# Patient Record
Sex: Female | Born: 1945 | Race: Black or African American | Hispanic: No | Smoking: Former smoker
Health system: Southern US, Community
[De-identification: ages and names within clinical notes are randomized; demographics above are authoritative.]

## PROBLEM LIST (undated history)

## (undated) DIAGNOSIS — I509 Heart failure, unspecified: Secondary | ICD-10-CM

## (undated) DIAGNOSIS — I10 Essential (primary) hypertension: Secondary | ICD-10-CM

## (undated) DIAGNOSIS — J449 Chronic obstructive pulmonary disease, unspecified: Secondary | ICD-10-CM

## (undated) DIAGNOSIS — N189 Chronic kidney disease, unspecified: Secondary | ICD-10-CM

## (undated) DIAGNOSIS — K219 Gastro-esophageal reflux disease without esophagitis: Secondary | ICD-10-CM

## (undated) DIAGNOSIS — I499 Cardiac arrhythmia, unspecified: Secondary | ICD-10-CM

## (undated) DIAGNOSIS — R569 Unspecified convulsions: Secondary | ICD-10-CM

## (undated) HISTORY — PX: ABDOMINAL HYSTERECTOMY: SHX81

## (undated) HISTORY — PX: CARDIAC VALVE REPLACEMENT: SHX585

## (undated) HISTORY — PX: REPLACEMENT TOTAL KNEE: SUR1224

## (undated) HISTORY — PX: OOPHORECTOMY: SHX86

## (undated) HISTORY — PX: TOTAL HIP ARTHROPLASTY: SHX124

## (undated) HISTORY — PX: AORTIC VALVE REPLACEMENT: SHX41

## (undated) HISTORY — PX: TUBAL LIGATION: SHX77

---

## 2008-11-08 DIAGNOSIS — R32 Unspecified urinary incontinence: Secondary | ICD-10-CM | POA: Insufficient documentation

## 2016-09-28 DIAGNOSIS — J42 Unspecified chronic bronchitis: Secondary | ICD-10-CM | POA: Insufficient documentation

## 2016-09-28 DIAGNOSIS — I10 Essential (primary) hypertension: Secondary | ICD-10-CM | POA: Insufficient documentation

## 2016-09-28 DIAGNOSIS — Z952 Presence of prosthetic heart valve: Secondary | ICD-10-CM | POA: Insufficient documentation

## 2016-09-28 DIAGNOSIS — R911 Solitary pulmonary nodule: Secondary | ICD-10-CM | POA: Insufficient documentation

## 2016-09-28 DIAGNOSIS — E7800 Pure hypercholesterolemia, unspecified: Secondary | ICD-10-CM | POA: Insufficient documentation

## 2016-11-23 ENCOUNTER — Other Ambulatory Visit: Payer: Self-pay | Admitting: Obstetrics & Gynecology

## 2016-11-23 DIAGNOSIS — Z1231 Encounter for screening mammogram for malignant neoplasm of breast: Secondary | ICD-10-CM

## 2016-12-27 ENCOUNTER — Ambulatory Visit: Payer: Self-pay

## 2017-01-06 DIAGNOSIS — Z0000 Encounter for general adult medical examination without abnormal findings: Secondary | ICD-10-CM | POA: Insufficient documentation

## 2017-01-19 ENCOUNTER — Ambulatory Visit
Admission: RE | Admit: 2017-01-19 | Discharge: 2017-01-19 | Disposition: A | Discharge status: Home / Self Care | Payer: Federal, State, Local not specified - PPO | Source: Ambulatory Visit | Attending: Obstetrics & Gynecology | Admitting: Obstetrics & Gynecology

## 2017-01-19 ENCOUNTER — Encounter: Payer: Self-pay | Admitting: Radiology

## 2017-01-19 DIAGNOSIS — Z1231 Encounter for screening mammogram for malignant neoplasm of breast: Secondary | ICD-10-CM | POA: Diagnosis not present

## 2017-02-15 ENCOUNTER — Other Ambulatory Visit: Payer: Self-pay | Admitting: Specialist

## 2017-02-15 DIAGNOSIS — R911 Solitary pulmonary nodule: Secondary | ICD-10-CM

## 2017-02-25 ENCOUNTER — Ambulatory Visit
Admission: RE | Admit: 2017-02-25 | Discharge: 2017-02-25 | Disposition: A | Discharge status: Home / Self Care | Payer: Federal, State, Local not specified - PPO | Source: Ambulatory Visit | Attending: Specialist | Admitting: Specialist

## 2017-02-25 DIAGNOSIS — R911 Solitary pulmonary nodule: Secondary | ICD-10-CM | POA: Diagnosis not present

## 2017-02-25 DIAGNOSIS — I2510 Atherosclerotic heart disease of native coronary artery without angina pectoris: Secondary | ICD-10-CM | POA: Diagnosis not present

## 2017-02-25 DIAGNOSIS — I700 Atherosclerosis of aorta: Secondary | ICD-10-CM | POA: Insufficient documentation

## 2017-02-25 DIAGNOSIS — J449 Chronic obstructive pulmonary disease, unspecified: Secondary | ICD-10-CM | POA: Insufficient documentation

## 2017-02-25 DIAGNOSIS — N289 Disorder of kidney and ureter, unspecified: Secondary | ICD-10-CM | POA: Diagnosis not present

## 2017-03-17 ENCOUNTER — Encounter: Payer: Self-pay | Admitting: Emergency Medicine

## 2017-03-17 ENCOUNTER — Emergency Department
Admission: EM | Admit: 2017-03-17 | Discharge: 2017-03-17 | Disposition: A | Discharge status: Home / Self Care | Payer: Federal, State, Local not specified - PPO | Attending: Emergency Medicine | Admitting: Emergency Medicine

## 2017-03-17 DIAGNOSIS — R040 Epistaxis: Secondary | ICD-10-CM

## 2017-03-17 LAB — CBC WITH DIFFERENTIAL/PLATELET
BASOS PCT: 1 %
Basophils Absolute: 0 10*3/uL (ref 0–0.1)
EOS ABS: 0.2 10*3/uL (ref 0–0.7)
Eosinophils Relative: 4 %
HEMATOCRIT: 38.6 % (ref 35.0–47.0)
HEMOGLOBIN: 12.7 g/dL (ref 12.0–16.0)
LYMPHS ABS: 1.6 10*3/uL (ref 1.0–3.6)
Lymphocytes Relative: 31 %
MCH: 26 pg (ref 26.0–34.0)
MCHC: 32.9 g/dL (ref 32.0–36.0)
MCV: 79 fL — ABNORMAL LOW (ref 80.0–100.0)
MONO ABS: 0.5 10*3/uL (ref 0.2–0.9)
MONOS PCT: 11 %
NEUTROS ABS: 2.8 10*3/uL (ref 1.4–6.5)
NEUTROS PCT: 53 %
Platelets: 156 10*3/uL (ref 150–440)
RBC: 4.88 MIL/uL (ref 3.80–5.20)
RDW: 14.3 % (ref 11.5–14.5)
WBC: 5.1 10*3/uL (ref 3.6–11.0)

## 2017-03-17 LAB — BASIC METABOLIC PANEL
Anion gap: 8 (ref 5–15)
BUN: 15 mg/dL (ref 6–20)
CHLORIDE: 108 mmol/L (ref 101–111)
CO2: 27 mmol/L (ref 22–32)
CREATININE: 1.09 mg/dL — AB (ref 0.44–1.00)
Calcium: 9.5 mg/dL (ref 8.9–10.3)
GFR calc Af Amer: 57 mL/min — ABNORMAL LOW (ref >60)
GFR calc non Af Amer: 49 mL/min — ABNORMAL LOW (ref >60)
GLUCOSE: 116 mg/dL — AB (ref 65–99)
POTASSIUM: 3.8 mmol/L (ref 3.5–5.1)
Sodium: 143 mmol/L (ref 135–145)

## 2017-03-17 LAB — PROTIME-INR
INR: 3.34
PROTHROMBIN TIME: 34.6 s — AB (ref 11.4–15.2)

## 2017-03-17 MED ORDER — AMOXICILLIN-POT CLAVULANATE 875-125 MG PO TABS: 1.0000 | ORAL_TABLET | Freq: Two times a day (BID) | ORAL | 0 refills | Status: AC

## 2017-03-17 MED ORDER — TRANEXAMIC ACID 1000 MG/10ML IV SOLN
500.0000 mg | Freq: Once | INTRAVENOUS | Status: AC
  Administered 2017-03-17: 500 mg via TOPICAL
  Filled 2017-03-17: qty 10

## 2017-03-17 MED ORDER — CEFUROXIME AXETIL 500 MG PO TABS
500.0000 mg | ORAL_TABLET | Freq: Once | ORAL | Status: DC
  Administered 2017-03-17: 500 mg via ORAL
  Filled 2017-03-17: qty 1

## 2017-03-17 MED ORDER — ACETAMINOPHEN 500 MG PO TABS
1000.0000 mg | ORAL_TABLET | Freq: Once | ORAL | Status: AC
  Administered 2017-03-17: 1000 mg via ORAL
  Filled 2017-03-17: qty 2

## 2017-03-17 MED ORDER — AMOXICILLIN-POT CLAVULANATE 875-125 MG PO TABS: 1.0000 | ORAL_TABLET | Freq: Once | ORAL | Status: DC

## 2017-03-17 MED ORDER — CEFUROXIME AXETIL 500 MG PO TABS: 500.0000 mg | ORAL_TABLET | Freq: Two times a day (BID) | ORAL | 0 refills | Status: AC

## 2017-03-17 MED ORDER — SILVER NITRATE-POT NITRATE 75-25 % EX MISC
CUTANEOUS | Status: AC
  Filled 2017-03-17: qty 1

## 2017-03-17 MED ORDER — TRANEXAMIC ACID 1000 MG/10ML IV SOLN: 500.0000 mg | Freq: Once | INTRAVENOUS | Status: DC

## 2017-03-17 NOTE — ED Provider Notes (Addendum)
[facility] Emergency Department Provider Note  ____________________________________________   First MD Initiated Contact with Patient 03/17/17 [phone]     (approximate)  I have reviewed the triage vital signs and the nursing notes.   HISTORY  Chief Complaint Epistaxis   HPI Janice Tyler is a 71 y.o. female with a history of an aortic valve replacement on Coumadin who is presenting with a left nostril nosebleed. She says that she has had a cough and cold with runny nose recently and thinks this is the trigger for her nose bleeding. She said that she also had her Coumadin dose raised several weeks ago but since then has had a blood draw with a normal/therapeutic INR.   History reviewed. No pertinent past medical history.  There are no active problems to display for this patient.   Past Surgical History:  Procedure Laterality Date  . ABDOMINAL HYSTERECTOMY    . AORTIC VALVE REPLACEMENT    . REPLACEMENT TOTAL KNEE Bilateral   . TOTAL HIP ARTHROPLASTY Bilateral     Prior to Admission medications   Not on File    Allergies Patient has no known allergies.  No family history on file.  Social History Social History  Substance Use Topics  . Smoking status: Never Smoker  . Smokeless tobacco: Never Used  . Alcohol use Not on file    Review of Systems  Constitutional: No fever/chills Eyes: No visual changes. ENT: No sore throat. Cardiovascular: Denies chest pain. Respiratory: Denies shortness of breath. Gastrointestinal: No abdominal pain.  No nausea, no vomiting.  No diarrhea.  No constipation. Genitourinary: Negative for dysuria. Musculoskeletal: Negative for back pain. Skin: Negative for rash. Neurological: Negative for headaches, focal weakness or numbness.   ____________________________________________   PHYSICAL EXAM:  VITAL SIGNS: ED Triage Vitals [03/17/17 0128]  Enc Vitals Group     BP (!) 158/62     Pulse Rate (!) 54   Resp 18     Temp 97.7 F (36.5 C)     Temp Source Oral     SpO2 98 %     Weight 150 lb (68 kg)     Height 5\' 2"  (1.575 m)     Head Circumference      Peak Flow      Pain Score      Pain Loc      Pain Edu?      Excl. in [place]?     Constitutional: Alert and oriented. Well appearing and in no acute distress. Eyes: Conjunctivae are normal. PERRL. EOMI. Head: Atraumatic. Nose:   Wearing the nasal clamps without any bleeding around the clamp. Nasal clamp was removed and blood seen in the left naris without any active bleeding in the posterior pharynx. Clamp was replaced.   Mouth/Throat: Mucous membranes are moist.  Oropharynx non-erythematous. Neck: No stridor.   Cardiovascular: Normal rate, regular rhythm. Grossly normal heart sounds.   Respiratory: Normal respiratory effort.  No retractions. Lungs CTAB. Gastrointestinal: Soft and nontender. No distention.  Musculoskeletal: No lower extremity tenderness nor edema.  No joint effusions. Neurologic:  Normal speech and language. No gross focal neurologic deficits are appreciated.  Skin:  Skin is warm, dry and intact. No rash noted. Psychiatric: Mood and affect are normal. Speech and behavior are normal.  ____________________________________________   LABS (all labs ordered are listed, but only abnormal results are displayed)  Labs Reviewed  CBC WITH DIFFERENTIAL/PLATELET - Abnormal; Notable for the following:       Result  Value   MCV 79.0 (*)    All other components within normal limits  BASIC METABOLIC PANEL - Abnormal; Notable for the following:    Glucose, Bld 116 (*)    Creatinine, Ser 1.09 (*)    GFR calc non Af Amer 49 (*)    GFR calc Af Amer 57 (*)    All other components within normal limits  PROTIME-INR - Abnormal; Notable for the following:    Prothrombin Time 34.6 (*)    All other components within normal limits    ____________________________________________  EKG   ____________________________________________  RADIOLOGY   ____________________________________________   PROCEDURES  Procedure(s) performed:   Procedures  Critical Care performed:   ____________________________________________   INITIAL IMPRESSION / ASSESSMENT AND PLAN / ED COURSE  Pertinent labs & imaging results that were available during my care of the patient were reviewed by me and considered in my medical decision making (see chart for details).  ----------------------------------------- 2:29 AM on 03/17/2017 -----------------------------------------  Clamp was removed and there is still oozing from the left nostril. I had the patient blow her nose and she blew out one very large clots. Clamp was then replaced.    ----------------------------------------- 5:40 AM on 03/17/2017 -----------------------------------------  After simple nasal clamps failed we inserted cotton soaked with TX A and to the left naris. Patient continued to bleed through the cotton ball soaked with [place] a. I was able to visualize several spots and cauterize with silver nitrate. However, the patient said she still felt the sensation of blood trickling down the back of her pharynx was unable to see any obvious trickling of blood down the back of the pharynx. She was also having consistent oozing still anteriorly. At this point the decision was made to place packing. A 5.5 cm Rhino Rocket was placed left naris. It was soaked in sterile water prior to insertion and inflation with air. Adequate tamponade was achieved with the balloon still compressible and not tight to the point to cause blood flow to be fully impeded. Patient says that she feels improved at this time. Does not have the sensation of blood trickling down the back of her throat anymore. Also without any bleeding anteriorly.  Clots were cleared with the patient blow her nose prior to  each attempt for hemostasis. She'll be placed on Ceftin and will follow up with ENT. Patient's INR 3.3. She says that her goal INR between 2.5 and 3.5.  ____________________________________________   FINAL CLINICAL IMPRESSION(S) / ED DIAGNOSES  Epistaxis.    NEW MEDICATIONS STARTED DURING THIS VISIT:  New Prescriptions   No medications on file     Note:  This document was prepared using Dragon voice recognition software and may include unintentional dictation errors.    Orbie Pyo, MD 03/17/17 [phone]  Patient is aware that she must monitor her INR closely and says that she is scheduled to have her INR checked by the end of the week. We discussed the possible rash between the Augmentin and Coumadin and she is aware, understanding and willing to comply with this plan.    Orbie Pyo, MD 03/17/17 [phone]

## 2017-03-17 NOTE — ED Triage Notes (Signed)
Patient ambulatory to triage with steady gait, without difficulty or distress noted; pt reports nosebleed from left nare for last 1-2hrs; denies hx of same; denies any c/o at present

## 2017-04-12 DIAGNOSIS — I2510 Atherosclerotic heart disease of native coronary artery without angina pectoris: Secondary | ICD-10-CM | POA: Insufficient documentation

## 2017-04-12 DIAGNOSIS — I700 Atherosclerosis of aorta: Secondary | ICD-10-CM | POA: Insufficient documentation

## 2017-06-06 DIAGNOSIS — G609 Hereditary and idiopathic neuropathy, unspecified: Secondary | ICD-10-CM | POA: Insufficient documentation

## 2017-06-06 DIAGNOSIS — G40909 Epilepsy, unspecified, not intractable, without status epilepticus: Secondary | ICD-10-CM | POA: Insufficient documentation

## 2017-09-07 ENCOUNTER — Other Ambulatory Visit: Payer: Self-pay | Admitting: Specialist

## 2017-09-07 DIAGNOSIS — R0609 Other forms of dyspnea: Secondary | ICD-10-CM

## 2017-09-07 DIAGNOSIS — R0600 Dyspnea, unspecified: Secondary | ICD-10-CM

## 2017-12-13 ENCOUNTER — Other Ambulatory Visit: Payer: Self-pay | Admitting: Internal Medicine

## 2017-12-13 DIAGNOSIS — Z1231 Encounter for screening mammogram for malignant neoplasm of breast: Secondary | ICD-10-CM

## 2017-12-28 ENCOUNTER — Other Ambulatory Visit: Payer: Self-pay | Admitting: Student

## 2017-12-28 DIAGNOSIS — M5441 Lumbago with sciatica, right side: Principal | ICD-10-CM

## 2017-12-28 DIAGNOSIS — G8929 Other chronic pain: Secondary | ICD-10-CM

## 2018-01-06 ENCOUNTER — Ambulatory Visit
Admission: RE | Admit: 2018-01-06 | Discharge: 2018-01-06 | Disposition: A | Discharge status: Home / Self Care | Payer: Federal, State, Local not specified - PPO | Source: Ambulatory Visit | Attending: Student | Admitting: Student

## 2018-01-06 DIAGNOSIS — M4316 Spondylolisthesis, lumbar region: Secondary | ICD-10-CM | POA: Diagnosis not present

## 2018-01-06 DIAGNOSIS — M48061 Spinal stenosis, lumbar region without neurogenic claudication: Secondary | ICD-10-CM | POA: Insufficient documentation

## 2018-01-06 DIAGNOSIS — M5127 Other intervertebral disc displacement, lumbosacral region: Secondary | ICD-10-CM | POA: Insufficient documentation

## 2018-01-06 DIAGNOSIS — M5126 Other intervertebral disc displacement, lumbar region: Secondary | ICD-10-CM | POA: Insufficient documentation

## 2018-01-06 DIAGNOSIS — M5441 Lumbago with sciatica, right side: Secondary | ICD-10-CM | POA: Diagnosis present

## 2018-01-06 DIAGNOSIS — M4807 Spinal stenosis, lumbosacral region: Secondary | ICD-10-CM | POA: Diagnosis not present

## 2018-01-06 DIAGNOSIS — G8929 Other chronic pain: Secondary | ICD-10-CM | POA: Diagnosis present

## 2018-01-10 ENCOUNTER — Other Ambulatory Visit: Payer: Self-pay | Admitting: Student

## 2018-01-10 DIAGNOSIS — M47816 Spondylosis without myelopathy or radiculopathy, lumbar region: Secondary | ICD-10-CM

## 2018-01-23 ENCOUNTER — Ambulatory Visit
Admission: RE | Admit: 2018-01-23 | Discharge: 2018-01-23 | Disposition: A | Discharge status: Home / Self Care | Payer: Federal, State, Local not specified - PPO | Source: Ambulatory Visit | Attending: Student | Admitting: Student

## 2018-01-23 ENCOUNTER — Other Ambulatory Visit: Payer: Self-pay | Admitting: Student

## 2018-01-23 DIAGNOSIS — M47816 Spondylosis without myelopathy or radiculopathy, lumbar region: Secondary | ICD-10-CM

## 2018-01-23 MED ORDER — METHYLPREDNISOLONE ACETATE 40 MG/ML INJ SUSP (RADIOLOG
120.0000 mg | Freq: Once | INTRAMUSCULAR | Status: AC
  Administered 2018-01-23: 120 mg via EPIDURAL

## 2018-01-23 MED ORDER — IOPAMIDOL (ISOVUE-M 200) INJECTION 41%
1.0000 mL | Freq: Once | INTRAMUSCULAR | Status: AC
  Administered 2018-01-23: 1 mL via EPIDURAL

## 2018-01-23 NOTE — Discharge Instructions (Signed)

## 2018-01-31 ENCOUNTER — Ambulatory Visit
Admission: RE | Admit: 2018-01-31 | Discharge: 2018-01-31 | Disposition: A | Discharge status: Home / Self Care | Payer: Federal, State, Local not specified - PPO | Source: Ambulatory Visit | Attending: Internal Medicine | Admitting: Internal Medicine

## 2018-01-31 DIAGNOSIS — Z1231 Encounter for screening mammogram for malignant neoplasm of breast: Secondary | ICD-10-CM

## 2018-02-07 ENCOUNTER — Inpatient Hospital Stay
Admission: RE | Admit: 2018-02-07 | Discharge: 2018-02-07 | Disposition: A | Discharge status: Home / Self Care | Payer: Self-pay | Source: Ambulatory Visit | Attending: *Deleted | Admitting: *Deleted

## 2018-02-07 ENCOUNTER — Other Ambulatory Visit: Payer: Self-pay | Admitting: *Deleted

## 2018-02-07 DIAGNOSIS — Z9289 Personal history of other medical treatment: Secondary | ICD-10-CM

## 2018-02-21 DIAGNOSIS — E785 Hyperlipidemia, unspecified: Secondary | ICD-10-CM | POA: Insufficient documentation

## 2018-02-21 DIAGNOSIS — E1169 Type 2 diabetes mellitus with other specified complication: Secondary | ICD-10-CM | POA: Insufficient documentation

## 2018-02-21 DIAGNOSIS — E118 Type 2 diabetes mellitus with unspecified complications: Secondary | ICD-10-CM | POA: Insufficient documentation

## 2018-03-02 ENCOUNTER — Telehealth: Payer: Self-pay | Admitting: *Deleted

## 2018-03-02 DIAGNOSIS — Z122 Encounter for screening for malignant neoplasm of respiratory organs: Secondary | ICD-10-CM

## 2018-03-02 DIAGNOSIS — Z87891 Personal history of nicotine dependence: Secondary | ICD-10-CM

## 2018-03-02 NOTE — Telephone Encounter (Signed)
Received referral for initial lung cancer screening scan. Contacted patient and obtained smoking history,(former, quit 2015, 42 pack year) as well as answering questions related to screening process. Patient denies signs of lung cancer such as weight loss or hemoptysis. Patient denies comorbidity that would prevent curative treatment if lung cancer were found. Patient is scheduled for shared decision making visit and CT scan on 03/21/18.

## 2018-03-21 ENCOUNTER — Inpatient Hospital Stay: Payer: Federal, State, Local not specified - PPO | Attending: Oncology | Admitting: Oncology

## 2018-03-21 ENCOUNTER — Ambulatory Visit
Admission: RE | Admit: 2018-03-21 | Discharge: 2018-03-21 | Disposition: A | Discharge status: Home / Self Care | Payer: Federal, State, Local not specified - PPO | Source: Ambulatory Visit | Attending: Oncology | Admitting: Oncology

## 2018-03-21 ENCOUNTER — Encounter: Payer: Self-pay | Admitting: Oncology

## 2018-03-21 DIAGNOSIS — Z87891 Personal history of nicotine dependence: Secondary | ICD-10-CM

## 2018-03-21 DIAGNOSIS — N289 Disorder of kidney and ureter, unspecified: Secondary | ICD-10-CM | POA: Insufficient documentation

## 2018-03-21 DIAGNOSIS — I2510 Atherosclerotic heart disease of native coronary artery without angina pectoris: Secondary | ICD-10-CM | POA: Diagnosis not present

## 2018-03-21 DIAGNOSIS — I700 Atherosclerosis of aorta: Secondary | ICD-10-CM | POA: Diagnosis not present

## 2018-03-21 DIAGNOSIS — N200 Calculus of kidney: Secondary | ICD-10-CM | POA: Insufficient documentation

## 2018-03-21 DIAGNOSIS — Z122 Encounter for screening for malignant neoplasm of respiratory organs: Secondary | ICD-10-CM | POA: Insufficient documentation

## 2018-03-21 DIAGNOSIS — J432 Centrilobular emphysema: Secondary | ICD-10-CM | POA: Diagnosis not present

## 2018-03-21 NOTE — Progress Notes (Signed)
In accordance with CMS guidelines, patient has met eligibility criteria including age, absence of signs or symptoms of lung cancer.  Social History   Tobacco Use  . Smoking status: Former Smoker    Packs/day: 1.00    Years: 42.00    Pack years: 42.00    Last attempt to quit: 2015    Years since quitting: 4.3  . Smokeless tobacco: Never Used  Substance Use Topics  . Alcohol use: Not on file  . Drug use: Not on file     A shared decision-making session was conducted prior to the performance of CT scan. This includes one or more decision aids, includes benefits and harms of screening, follow-up diagnostic testing, over-diagnosis, false positive rate, and total radiation exposure.  Counseling on the importance of adherence to annual lung cancer LDCT screening, impact of co-morbidities, and ability or willingness to undergo diagnosis and treatment is imperative for compliance of the program.  Counseling on the importance of continued smoking cessation for former smokers; the importance of smoking cessation for current smokers, and information about tobacco cessation interventions have been given to patient including [place] and 1800 quit [place] programs.  Written order for lung cancer screening with LDCT has been given to the patient and any and all questions have been answered to the best of my abilities.   Yearly follow up will be coordinated by Burgess Estelle, Thoracic Navigator.  Faythe Casa, NP 03/21/2018 2:42 PM

## 2018-03-24 ENCOUNTER — Encounter: Payer: Self-pay | Admitting: *Deleted

## 2018-08-10 ENCOUNTER — Ambulatory Visit
Admission: RE | Admit: 2018-08-10 | Discharge: 2018-08-10 | Disposition: A | Discharge status: Home / Self Care | Payer: Federal, State, Local not specified - PPO | Source: Ambulatory Visit | Attending: Specialist | Admitting: Specialist

## 2018-08-10 DIAGNOSIS — J449 Chronic obstructive pulmonary disease, unspecified: Secondary | ICD-10-CM | POA: Diagnosis not present

## 2018-08-10 DIAGNOSIS — R0609 Other forms of dyspnea: Secondary | ICD-10-CM

## 2018-08-10 DIAGNOSIS — R0600 Dyspnea, unspecified: Secondary | ICD-10-CM

## 2018-08-23 DIAGNOSIS — Z9989 Dependence on other enabling machines and devices: Secondary | ICD-10-CM | POA: Insufficient documentation

## 2018-09-30 ENCOUNTER — Other Ambulatory Visit: Payer: Self-pay

## 2018-09-30 ENCOUNTER — Encounter: Payer: Self-pay | Admitting: Emergency Medicine

## 2018-09-30 ENCOUNTER — Emergency Department
Admission: EM | Admit: 2018-09-30 | Discharge: 2018-09-30 | Disposition: A | Discharge status: Home / Self Care | Payer: Federal, State, Local not specified - PPO | Attending: Student in an Organized Health Care Education/Training Program | Admitting: Student in an Organized Health Care Education/Training Program

## 2018-09-30 DIAGNOSIS — Z96652 Presence of left artificial knee joint: Secondary | ICD-10-CM | POA: Diagnosis not present

## 2018-09-30 DIAGNOSIS — Z96651 Presence of right artificial knee joint: Secondary | ICD-10-CM | POA: Diagnosis not present

## 2018-09-30 DIAGNOSIS — M5431 Sciatica, right side: Secondary | ICD-10-CM | POA: Diagnosis not present

## 2018-09-30 DIAGNOSIS — Z96641 Presence of right artificial hip joint: Secondary | ICD-10-CM | POA: Diagnosis not present

## 2018-09-30 DIAGNOSIS — Z96642 Presence of left artificial hip joint: Secondary | ICD-10-CM | POA: Diagnosis not present

## 2018-09-30 DIAGNOSIS — Z87891 Personal history of nicotine dependence: Secondary | ICD-10-CM | POA: Diagnosis not present

## 2018-09-30 DIAGNOSIS — M5441 Lumbago with sciatica, right side: Secondary | ICD-10-CM | POA: Insufficient documentation

## 2018-09-30 DIAGNOSIS — M549 Dorsalgia, unspecified: Secondary | ICD-10-CM | POA: Diagnosis present

## 2018-09-30 DIAGNOSIS — Z952 Presence of prosthetic heart valve: Secondary | ICD-10-CM | POA: Diagnosis not present

## 2018-09-30 DIAGNOSIS — G8929 Other chronic pain: Secondary | ICD-10-CM | POA: Insufficient documentation

## 2018-09-30 DIAGNOSIS — I10 Essential (primary) hypertension: Secondary | ICD-10-CM | POA: Insufficient documentation

## 2018-09-30 HISTORY — DX: Essential (primary) hypertension: I10

## 2018-09-30 LAB — URINALYSIS, COMPLETE (UACMP) WITH MICROSCOPIC
BILIRUBIN URINE: NEGATIVE
Bacteria, UA: NONE SEEN
Glucose, UA: NEGATIVE mg/dL
HGB URINE DIPSTICK: NEGATIVE
Ketones, ur: NEGATIVE mg/dL
LEUKOCYTES UA: NEGATIVE
NITRITE: NEGATIVE
PROTEIN: 100 mg/dL — AB
Specific Gravity, Urine: 1.02 (ref 1.005–1.030)
pH: 5 (ref 5.0–8.0)

## 2018-09-30 MED ORDER — PREDNISONE 10 MG (48) PO TBPK: ORAL_TABLET | ORAL | 0 refills | Status: DC

## 2018-09-30 MED ORDER — HYDROCODONE-ACETAMINOPHEN 5-325 MG PO TABS: 1.0000 | ORAL_TABLET | Freq: Four times a day (QID) | ORAL | 0 refills | Status: DC | PRN

## 2018-09-30 MED ORDER — HYDROCODONE-ACETAMINOPHEN 5-325 MG PO TABS
1.0000 | ORAL_TABLET | Freq: Once | ORAL | Status: AC
  Administered 2018-09-30: 1 via ORAL
  Filled 2018-09-30: qty 1

## 2018-09-30 NOTE — ED Provider Notes (Signed)
[facility] Emergency Department Provider Note  ____________________________________________   First MD Initiated Contact with Patient 09/30/18 1140     (approximate)  I have reviewed the triage vital signs and the nursing notes.   HISTORY  Chief Complaint Back Pain   HPI Janice Tyler is a 72 y.o. female presents to the ED with complaint of chronic back pain with radiation down her right leg.  Patient states that she has been tolerating this for approximately 1 week.  She denies any recent injury.  She has a history of low back pain and is had epidural injections in the past.  She also has been on prednisone without any problems with her diabetes type 2.  Patient denies any urinary symptoms or incontinence of bowel or bladder.  She has seen [facility] orthopedic department in the past for her back problems.  She rates her pain as a 10/10.  Past Medical History:  Diagnosis Date  . Hypertension     There are no active problems to display for this patient.   Past Surgical History:  Procedure Laterality Date  . ABDOMINAL HYSTERECTOMY    . AORTIC VALVE REPLACEMENT    . OOPHORECTOMY    . REPLACEMENT TOTAL KNEE Bilateral   . TOTAL HIP ARTHROPLASTY Bilateral     Prior to Admission medications   Medication Sig Start Date End Date Taking? Authorizing Provider  HYDROcodone-acetaminophen (NORCO/VICODIN) 5-325 MG tablet Take 1 tablet by mouth every 6 (six) hours as needed for moderate pain. 09/30/18   Johnn Hai, PA-C  predniSONE (STERAPRED UNI-PAK 48 TAB) 10 MG (48) TBPK tablet As directed 09/30/18   Johnn Hai, PA-C    Allergies Patient has no known allergies.  History reviewed. No pertinent family history.  Social History Social History   Tobacco Use  . Smoking status: Former Smoker    Packs/day: 1.00    Years: 42.00    Pack years: 42.00    Last attempt to quit: 2015    Years since quitting: 4.8  . Smokeless tobacco: Never  Used  Substance Use Topics  . Alcohol use: Never    Frequency: Never  . Drug use: Never    Review of Systems Constitutional: No fever/chills Cardiovascular: Denies chest pain. Respiratory: Denies shortness of breath. Gastrointestinal: No abdominal pain.  No nausea, no vomiting.  No diarrhea.  No constipation. Genitourinary: Negative for dysuria. Musculoskeletal: Positive for chronic low back pain with acute exacerbation.  Positive for right leg radiculopathy. Skin: Negative for rash. Neurological: Negative for headaches, focal weakness or numbness. ___________________________________________   PHYSICAL EXAM:  VITAL SIGNS: ED Triage Vitals  Enc Vitals Group     BP 09/30/18 1122 (!) 156/59     Pulse Rate 09/30/18 1122 (!) 53     Resp 09/30/18 1122 18     Temp 09/30/18 1122 98 F (36.7 C)     Temp Source 09/30/18 1122 Oral     SpO2 09/30/18 1122 99 %     Weight 09/30/18 1123 147 lb 14.9 oz (67.1 kg)     Height --      Head Circumference --      Peak Flow --      Pain Score 09/30/18 1122 10     Pain Loc --      Pain Edu? --      Excl. in [place]? --    Constitutional: Alert and oriented. Well appearing and in no acute distress. Eyes: Conjunctivae are normal.  Head: Atraumatic. Neck: No stridor.   Cardiovascular: Normal rate, regular rhythm. Grossly normal heart sounds.  Good peripheral circulation. Respiratory: Normal respiratory effort.  No retractions. Lungs CTAB. Gastrointestinal: Soft and nontender. No distention.  No CVA tenderness. Musculoskeletal: Examination of lumbar spine there is no point tenderness on palpation of the vertebral bodies however there is moderate tenderness on pain patient of the right SI joint area and soft tissue.  Good muscle strength bilaterally.  Straight leg raises were negative on the left lower extremity.  Right lower extremity is approximately 30 degrees with patient sitting. Neurologic:  Normal speech and language. No gross focal neurologic  deficits are appreciated.  Reflexes are 2+ bilaterally.  Skin:  Skin is warm, dry and intact. No rash noted. Psychiatric: Mood and affect are normal. Speech and behavior are normal.  ____________________________________________   LABS (all labs ordered are listed, but only abnormal results are displayed)  Labs Reviewed  URINALYSIS, COMPLETE (UACMP) WITH MICROSCOPIC - Abnormal; Notable for the following components:      Result Value   Color, Urine YELLOW (*)    APPearance CLEAR (*)    Protein, ur 100 (*)    All other components within normal limits    PROCEDURES  Procedure(s) performed: None  Procedures  Critical Care performed: No  ____________________________________________   INITIAL IMPRESSION / ASSESSMENT AND PLAN / ED COURSE  As part of my medical decision making, I reviewed the following data within the electronic MEDICAL RECORD NUMBER Notes from prior ED visits and [place] Controlled Substance Database  Patient presents to the ED with complaint of low back pain with radiation into her right leg.  She does have a history of chronic back pain with right leg radiculopathy.  Patient has been seen at [facility] orthopedic department for her back pain.  Records indicate that the last time she was on steroids was in January which preceded an epidural injection.  Patient has been seen at the orthopedic department since that time.  She denies any urinary symptoms and urinalysis was negative.  Patient was given Norco while in the ED with some relief of her back pain.  We will do a another course of prednisone and patient is aware that this may interfere or increase her blood sugar.  Patient states that she had no problems taking this in January.  She is to call the orthopedic department at [facility] and make an appointment for reevaluation.  Also a prescription for Norco was given as needed for moderate to severe pain.  She is is aware that she needs to return to the emergency  department if any severe worsening of her symptoms.   ____________________________________________   FINAL CLINICAL IMPRESSION(S) / ED DIAGNOSES  Final diagnoses:  Sciatica of right side  Chronic right-sided low back pain with right-sided sciatica     ED Discharge Orders         Ordered    predniSONE (STERAPRED UNI-PAK 48 TAB) 10 MG (48) TBPK tablet     09/30/18 1246    HYDROcodone-acetaminophen (NORCO/VICODIN) 5-325 MG tablet  Every 6 hours PRN     09/30/18 1246           Note:  This document was prepared using Dragon voice recognition software and may include unintentional dictation errors.    Johnn Hai, PA-C 09/30/18 1300    Merlyn Lot, MD 09/30/18 [phone]

## 2018-09-30 NOTE — ED Notes (Signed)
Pt states pain in right leg started in calf and has moved up to her lower back.

## 2018-09-30 NOTE — ED Triage Notes (Signed)
Pt to ed with c/o right lower back pain and R leg pain x 1 week.  Denies injury. Pt states pain worse with any movement.

## 2018-09-30 NOTE — Discharge Instructions (Addendum)
Call make an appointment with the orthopedist if any continued problems.  Also talked to [facility] about future epidurals and wear you may require one while being sedated.  Begin taking prednisone as directed.  Norco is for pain if needed every 6 hours.  Do not drive or operate machinery while taking this medication.  This medication could cause drowsiness and increase her risk for falling.  Return to the emergency department if any severe worsening of your symptoms.

## 2018-09-30 NOTE — ED Notes (Signed)
Pt verbalized understanding of discharge instructions. NAD at this time. 

## 2018-10-07 IMAGING — MG MM DIGITAL SCREENING BILAT W/ TOMO W/ CAD
8 of 12 series · 8 of 28 positions shown · non-contrast
Comparison: Previous exam(s).

CLINICAL DATA: Screening.

EXAM:
DIGITAL SCREENING BILATERAL MAMMOGRAM WITH TOMO AND CAD

[R MLO synth-2D]
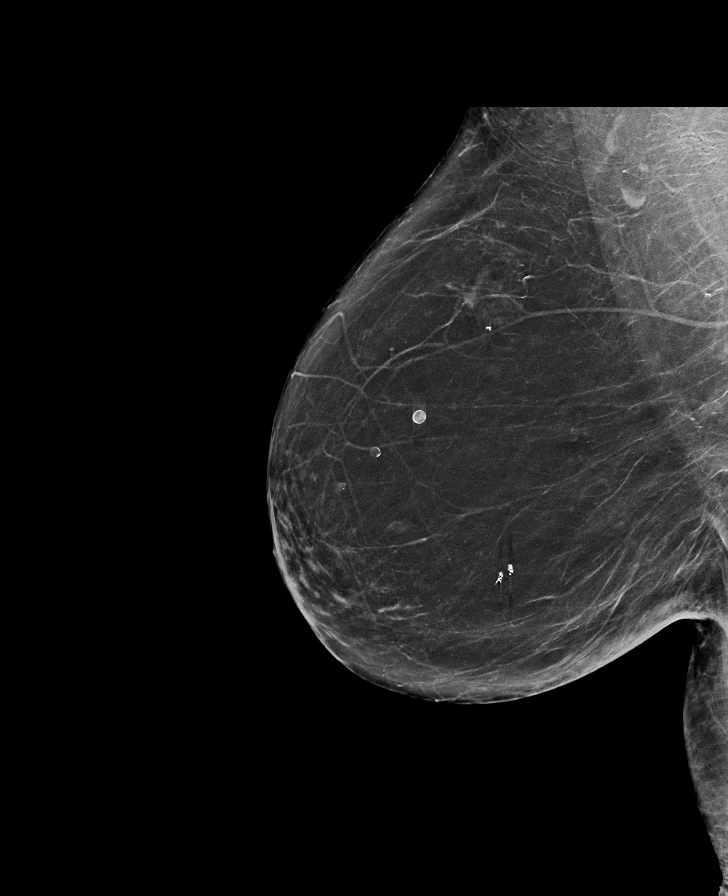

[L MLO]
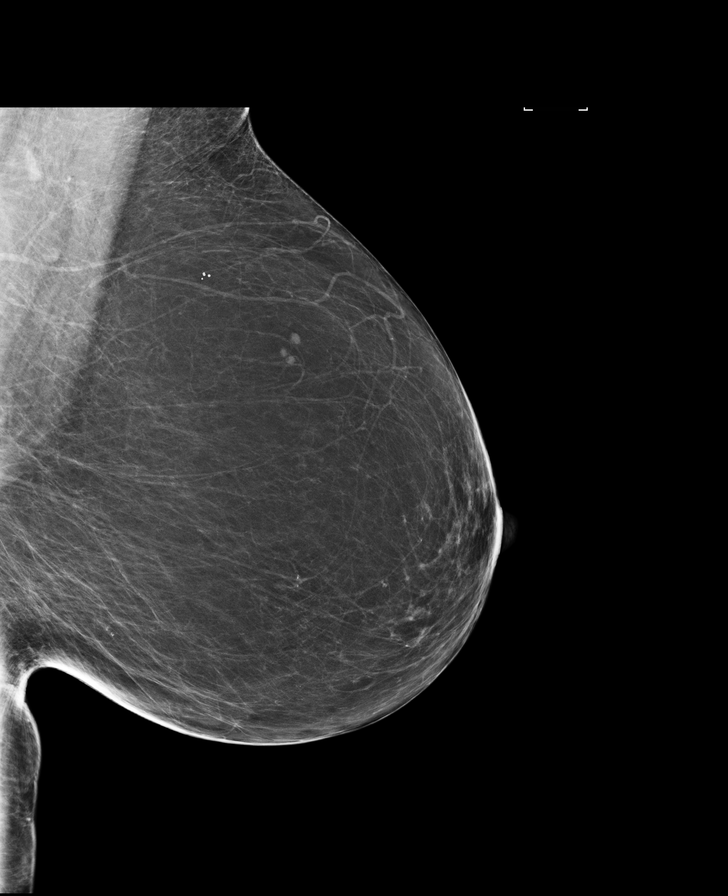

[R CC synth-2D]
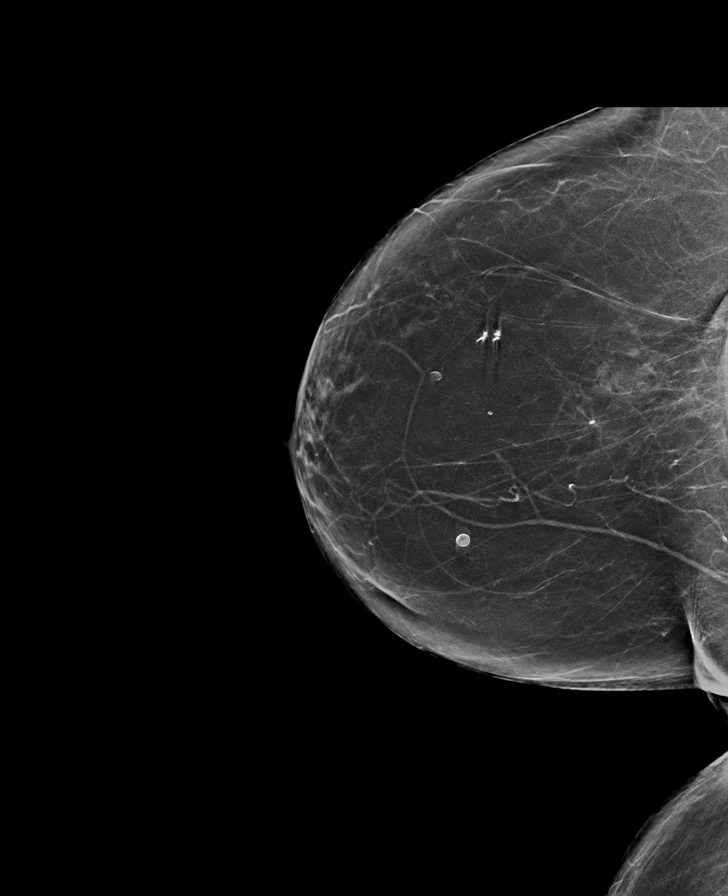

[L CC synth-2D]
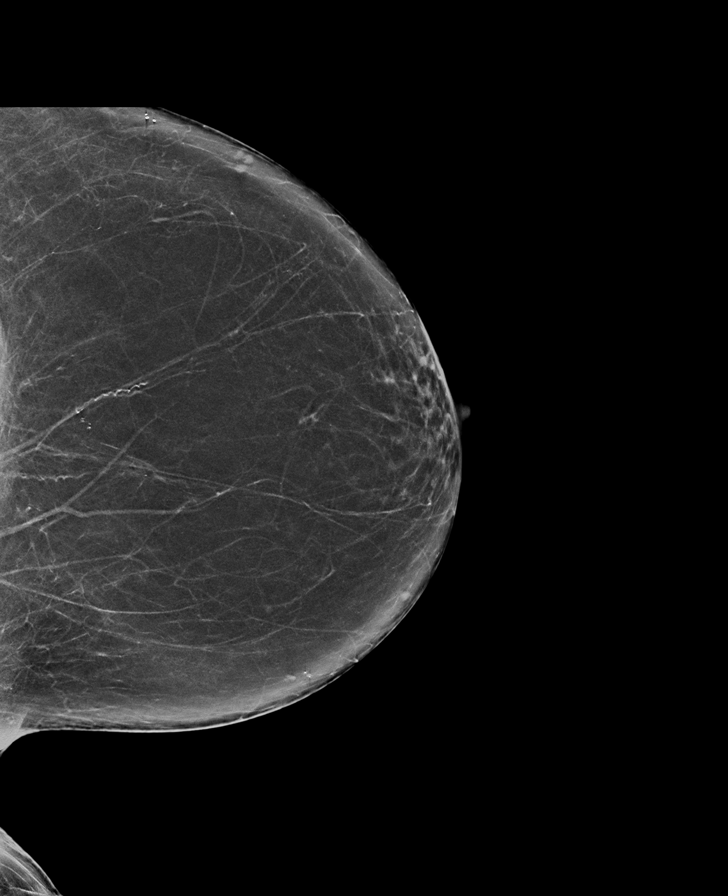

[L MLO synth-2D]
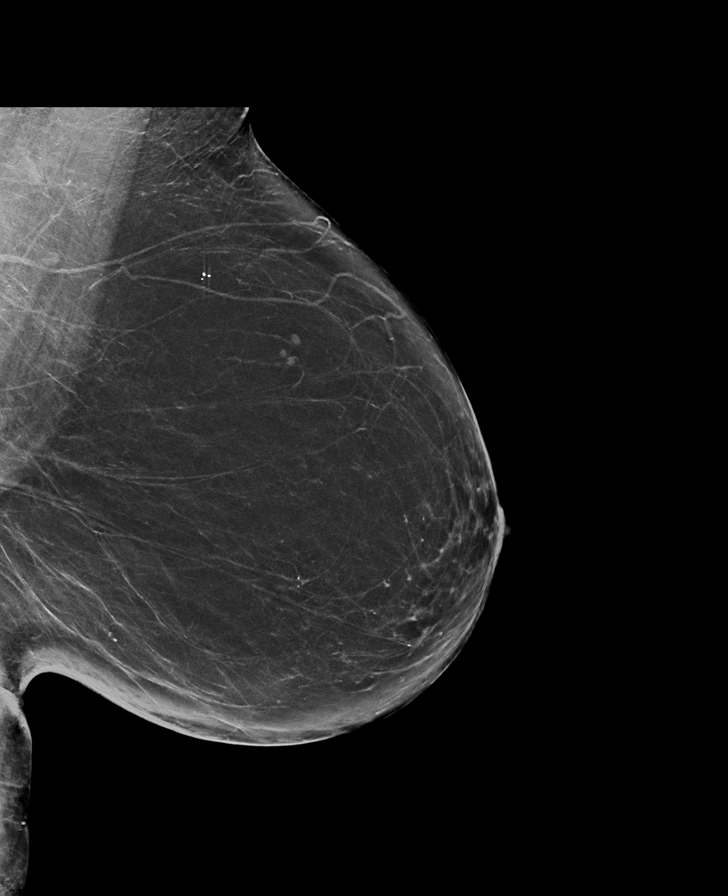

[R MLO]
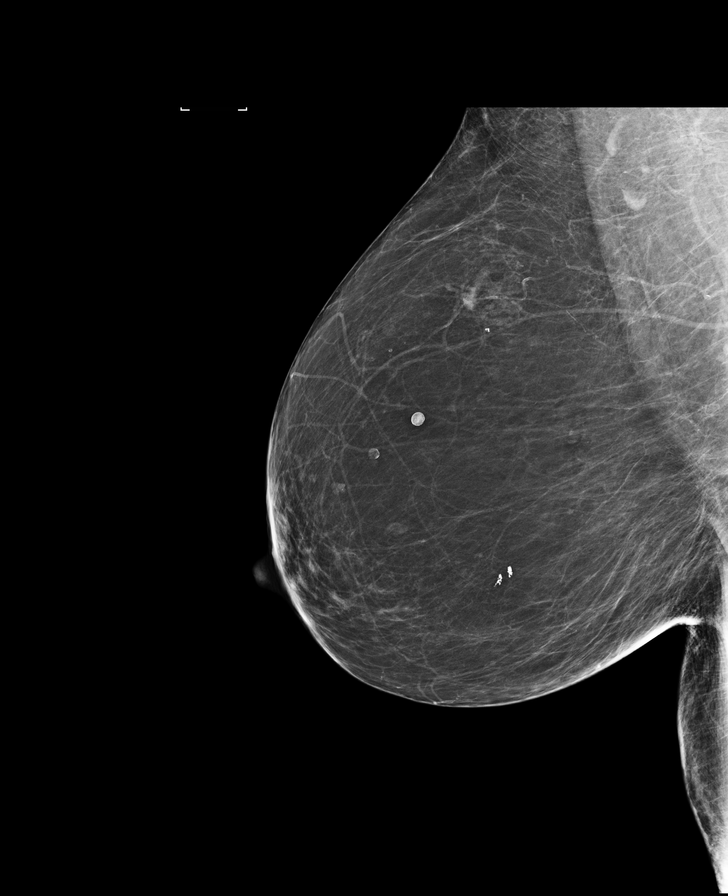

[L CC]
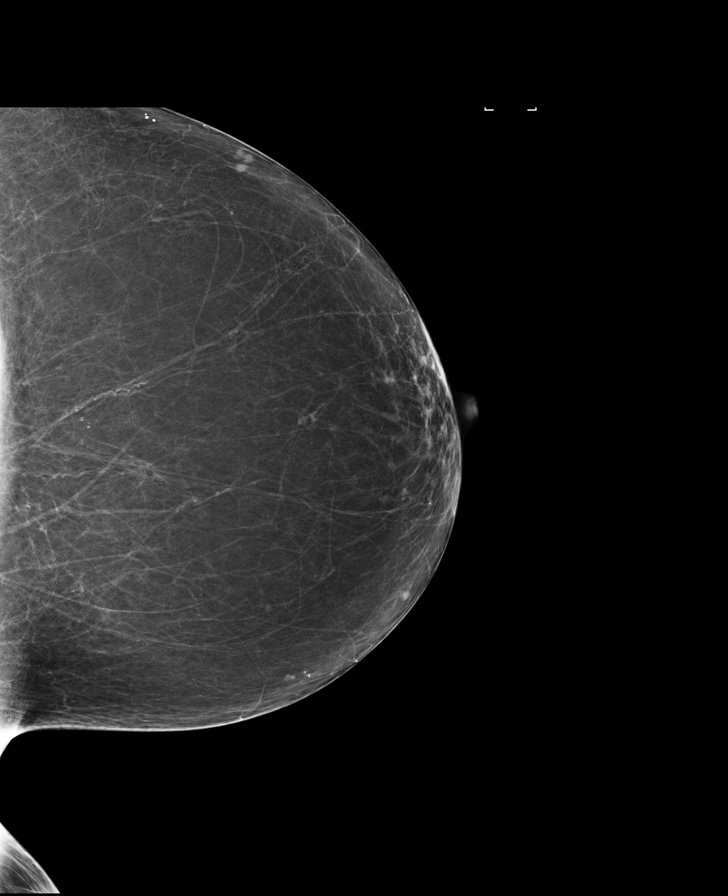

[R CC]
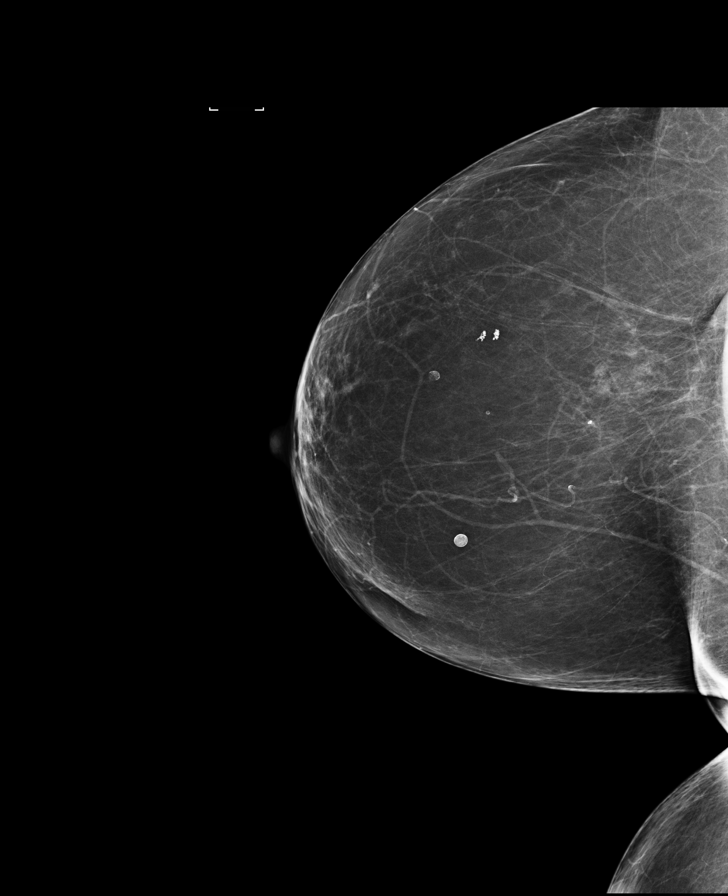

[8 of 28 positions shown; findings below may reference images not displayed]

ACR Breast Density Category b: There are scattered areas of
fibroglandular density.
FINDINGS: There are no findings suspicious for malignancy. Images were
processed with CAD.
IMPRESSION: No mammographic evidence of malignancy. A result letter of this
screening mammogram will be mailed directly to the patient.

RECOMMENDATION:
Screening mammogram in one year. (Code:[id number])

BI-RADS CATEGORY  1: Negative.

## 2018-12-21 ENCOUNTER — Other Ambulatory Visit: Payer: Self-pay | Admitting: Internal Medicine

## 2018-12-21 DIAGNOSIS — Z1231 Encounter for screening mammogram for malignant neoplasm of breast: Secondary | ICD-10-CM

## 2018-12-25 ENCOUNTER — Other Ambulatory Visit: Payer: Self-pay | Admitting: Orthopedic Surgery

## 2018-12-25 DIAGNOSIS — M7581 Other shoulder lesions, right shoulder: Secondary | ICD-10-CM

## 2018-12-25 DIAGNOSIS — M25311 Other instability, right shoulder: Secondary | ICD-10-CM

## 2019-01-03 ENCOUNTER — Ambulatory Visit
Admission: RE | Admit: 2019-01-03 | Discharge: 2019-01-03 | Disposition: A | Discharge status: Home / Self Care | Payer: Federal, State, Local not specified - PPO | Source: Ambulatory Visit | Attending: Orthopedic Surgery | Admitting: Orthopedic Surgery

## 2019-01-03 DIAGNOSIS — M25311 Other instability, right shoulder: Secondary | ICD-10-CM | POA: Insufficient documentation

## 2019-01-03 DIAGNOSIS — M7581 Other shoulder lesions, right shoulder: Secondary | ICD-10-CM | POA: Diagnosis not present

## 2019-02-06 ENCOUNTER — Ambulatory Visit: Payer: Federal, State, Local not specified - PPO

## 2019-03-08 ENCOUNTER — Encounter: Payer: Self-pay | Admitting: *Deleted

## 2019-03-14 ENCOUNTER — Ambulatory Visit: Payer: Federal, State, Local not specified - PPO

## 2019-04-17 ENCOUNTER — Ambulatory Visit
Admission: RE | Admit: 2019-04-17 | Discharge: 2019-04-17 | Disposition: A | Discharge status: Home / Self Care | Payer: Federal, State, Local not specified - PPO | Source: Ambulatory Visit | Attending: Internal Medicine | Admitting: Internal Medicine

## 2019-04-17 ENCOUNTER — Other Ambulatory Visit: Payer: Self-pay

## 2019-04-17 DIAGNOSIS — Z1231 Encounter for screening mammogram for malignant neoplasm of breast: Secondary | ICD-10-CM | POA: Diagnosis not present

## 2019-04-23 ENCOUNTER — Telehealth: Payer: Self-pay | Admitting: *Deleted

## 2019-04-23 DIAGNOSIS — Z87891 Personal history of nicotine dependence: Secondary | ICD-10-CM

## 2019-04-23 DIAGNOSIS — Z122 Encounter for screening for malignant neoplasm of respiratory organs: Secondary | ICD-10-CM

## 2019-04-23 NOTE — Telephone Encounter (Signed)
Patient has been notified that annual lung cancer screening low dose CT scan is due currently or will be in near future. Confirmed that patient is within the age range of 55-77, and asymptomatic, (no signs or symptoms of lung cancer). Patient denies illness that would prevent curative treatment for lung cancer if found. Verified smoking history, (former, quit 2015, 42 pack year). The shared decision making visit was done 03/21/18. Patient is agreeable for CT scan being scheduled.

## 2019-04-26 ENCOUNTER — Other Ambulatory Visit: Payer: Self-pay

## 2019-04-26 ENCOUNTER — Ambulatory Visit
Admission: RE | Admit: 2019-04-26 | Discharge: 2019-04-26 | Disposition: A | Discharge status: Home / Self Care | Payer: Federal, State, Local not specified - PPO | Source: Ambulatory Visit | Attending: Oncology | Admitting: Oncology

## 2019-04-26 DIAGNOSIS — Z122 Encounter for screening for malignant neoplasm of respiratory organs: Secondary | ICD-10-CM | POA: Insufficient documentation

## 2019-04-26 DIAGNOSIS — Z87891 Personal history of nicotine dependence: Secondary | ICD-10-CM | POA: Diagnosis present

## 2019-04-27 ENCOUNTER — Encounter: Payer: Self-pay | Admitting: *Deleted

## 2019-06-14 ENCOUNTER — Other Ambulatory Visit
Admission: RE | Admit: 2019-06-14 | Discharge: 2019-06-14 | Disposition: A | Discharge status: Home / Self Care | Payer: Federal, State, Local not specified - PPO | Source: Ambulatory Visit | Attending: Internal Medicine | Admitting: Internal Medicine

## 2019-06-14 ENCOUNTER — Other Ambulatory Visit: Payer: Self-pay

## 2019-06-14 DIAGNOSIS — Z01812 Encounter for preprocedural laboratory examination: Secondary | ICD-10-CM | POA: Insufficient documentation

## 2019-06-14 DIAGNOSIS — Z20828 Contact with and (suspected) exposure to other viral communicable diseases: Secondary | ICD-10-CM | POA: Insufficient documentation

## 2019-06-14 LAB — SARS CORONAVIRUS 2 (TAT 6-24 HRS): SARS Coronavirus 2: NEGATIVE

## 2019-06-15 ENCOUNTER — Encounter: Payer: Self-pay | Admitting: *Deleted

## 2019-06-18 ENCOUNTER — Encounter
Admission: RE | Disposition: A | Discharge status: Home / Self Care | Payer: Self-pay | Source: Home / Self Care | Attending: Internal Medicine

## 2019-06-18 ENCOUNTER — Ambulatory Visit: Payer: Federal, State, Local not specified - PPO | Admitting: Anesthesiology

## 2019-06-18 ENCOUNTER — Other Ambulatory Visit: Payer: Self-pay

## 2019-06-18 ENCOUNTER — Ambulatory Visit
Admission: RE | Admit: 2019-06-18 | Discharge: 2019-06-18 | Disposition: A | Discharge status: Home / Self Care | Payer: Federal, State, Local not specified - PPO | Attending: Internal Medicine | Admitting: Internal Medicine

## 2019-06-18 ENCOUNTER — Ambulatory Visit
Admission: RE | Admit: 2019-06-18 | Payer: Federal, State, Local not specified - PPO | Source: Home / Self Care | Admitting: Internal Medicine

## 2019-06-18 ENCOUNTER — Encounter: Admission: RE | Payer: Self-pay | Source: Home / Self Care

## 2019-06-18 ENCOUNTER — Encounter: Payer: Self-pay | Admitting: *Deleted

## 2019-06-18 DIAGNOSIS — K219 Gastro-esophageal reflux disease without esophagitis: Secondary | ICD-10-CM | POA: Diagnosis not present

## 2019-06-18 DIAGNOSIS — Z791 Long term (current) use of non-steroidal anti-inflammatories (NSAID): Secondary | ICD-10-CM | POA: Diagnosis not present

## 2019-06-18 DIAGNOSIS — D124 Benign neoplasm of descending colon: Secondary | ICD-10-CM | POA: Diagnosis not present

## 2019-06-18 DIAGNOSIS — I4891 Unspecified atrial fibrillation: Secondary | ICD-10-CM | POA: Diagnosis not present

## 2019-06-18 DIAGNOSIS — K5730 Diverticulosis of large intestine without perforation or abscess without bleeding: Secondary | ICD-10-CM | POA: Diagnosis not present

## 2019-06-18 DIAGNOSIS — D120 Benign neoplasm of cecum: Secondary | ICD-10-CM | POA: Insufficient documentation

## 2019-06-18 DIAGNOSIS — Z7951 Long term (current) use of inhaled steroids: Secondary | ICD-10-CM | POA: Insufficient documentation

## 2019-06-18 DIAGNOSIS — I509 Heart failure, unspecified: Secondary | ICD-10-CM | POA: Diagnosis not present

## 2019-06-18 DIAGNOSIS — Z7901 Long term (current) use of anticoagulants: Secondary | ICD-10-CM | POA: Diagnosis not present

## 2019-06-18 DIAGNOSIS — K642 Third degree hemorrhoids: Secondary | ICD-10-CM | POA: Insufficient documentation

## 2019-06-18 DIAGNOSIS — Z86010 Personal history of colonic polyps: Secondary | ICD-10-CM | POA: Insufficient documentation

## 2019-06-18 DIAGNOSIS — J449 Chronic obstructive pulmonary disease, unspecified: Secondary | ICD-10-CM | POA: Insufficient documentation

## 2019-06-18 DIAGNOSIS — Z79899 Other long term (current) drug therapy: Secondary | ICD-10-CM | POA: Diagnosis not present

## 2019-06-18 DIAGNOSIS — I130 Hypertensive heart and chronic kidney disease with heart failure and stage 1 through stage 4 chronic kidney disease, or unspecified chronic kidney disease: Secondary | ICD-10-CM | POA: Insufficient documentation

## 2019-06-18 DIAGNOSIS — Z1211 Encounter for screening for malignant neoplasm of colon: Secondary | ICD-10-CM | POA: Insufficient documentation

## 2019-06-18 DIAGNOSIS — N189 Chronic kidney disease, unspecified: Secondary | ICD-10-CM | POA: Diagnosis not present

## 2019-06-18 DIAGNOSIS — Z7982 Long term (current) use of aspirin: Secondary | ICD-10-CM | POA: Insufficient documentation

## 2019-06-18 DIAGNOSIS — R569 Unspecified convulsions: Secondary | ICD-10-CM | POA: Insufficient documentation

## 2019-06-18 HISTORY — DX: Heart failure, unspecified: I50.9

## 2019-06-18 HISTORY — DX: Unspecified convulsions: R56.9

## 2019-06-18 HISTORY — DX: Gastro-esophageal reflux disease without esophagitis: K21.9

## 2019-06-18 HISTORY — DX: Chronic kidney disease, unspecified: N18.9

## 2019-06-18 HISTORY — DX: Cardiac arrhythmia, unspecified: I49.9

## 2019-06-18 HISTORY — DX: Chronic obstructive pulmonary disease, unspecified: J44.9

## 2019-06-18 HISTORY — PX: COLONOSCOPY WITH PROPOFOL: SHX5780

## 2019-06-18 SURGERY — COLONOSCOPY WITH PROPOFOL
Anesthesia: General

## 2019-06-18 MED ORDER — PROPOFOL 10 MG/ML IV BOLUS
INTRAVENOUS | Status: DC | PRN
  Administered 2019-06-18 (×8): 50 mg via INTRAVENOUS

## 2019-06-18 MED ORDER — PHENYLEPHRINE HCL (PRESSORS) 10 MG/ML IV SOLN
INTRAVENOUS | Status: DC | PRN
  Administered 2019-06-18: 100 ug via INTRAVENOUS

## 2019-06-18 MED ORDER — SODIUM CHLORIDE 0.9 % IV SOLN
INTRAVENOUS | Status: DC
  Administered 2019-06-18: 15:00:00 via INTRAVENOUS

## 2019-06-18 MED ORDER — PHENYLEPHRINE HCL (PRESSORS) 10 MG/ML IV SOLN: INTRAVENOUS | Status: DC | PRN

## 2019-06-18 NOTE — Anesthesia Preprocedure Evaluation (Signed)
Anesthesia Evaluation  Patient identified by MRN, date of birth, ID band Patient awake    Reviewed: Allergy & Precautions, H&P , NPO status , Patient's Chart, lab work & pertinent test results, reviewed documented beta blocker date and time   Airway Mallampati: II   Neck ROM: full    Dental  (+) Poor Dentition   Pulmonary neg pulmonary ROS, COPD, Patient did not abstain from smoking., former smoker,    Pulmonary exam normal        Cardiovascular Exercise Tolerance: Good hypertension, On Medications +CHF  negative cardio ROS Normal cardiovascular exam+ dysrhythmias  Rhythm:regular Rate:Normal     Neuro/Psych Seizures -,  negative neurological ROS  negative psych ROS   GI/Hepatic negative GI ROS, Neg liver ROS, GERD  Medicated,  Endo/Other  negative endocrine ROS  Renal/GU Renal diseasenegative Renal ROS  negative genitourinary   Musculoskeletal   Abdominal   Peds  Hematology negative hematology ROS (+)   Anesthesia Other Findings Past Medical History: No date: CHF (congestive heart failure) (HCC) No date: Chronic kidney disease     Comment:  CKD No date: COPD (chronic obstructive pulmonary disease) (HCC) No date: Dysrhythmia     Comment:  Atrial Fibrillation No date: GERD (gastroesophageal reflux disease) No date: Hypertension No date: Seizures ([place]) Past Surgical History: No date: ABDOMINAL HYSTERECTOMY No date: AORTIC VALVE REPLACEMENT No date: CARDIAC VALVE REPLACEMENT No date: OOPHORECTOMY No date: REPLACEMENT TOTAL KNEE; Bilateral No date: TOTAL HIP ARTHROPLASTY; Bilateral No date: TUBAL LIGATION BMI    Body Mass Index: 25.19 kg/m     Reproductive/Obstetrics negative OB ROS                             Anesthesia Physical Anesthesia Plan  ASA: III  Anesthesia Plan: General   Post-op Pain Management:    Induction:   PONV Risk Score and Plan:   Airway Management  Planned:   Additional Equipment:   Intra-op Plan:   Post-operative Plan:   Informed Consent: I have reviewed the patients History and Physical, chart, labs and discussed the procedure including the risks, benefits and alternatives for the proposed anesthesia with the patient or authorized representative who has indicated his/her understanding and acceptance.     Dental Advisory Given  Plan Discussed with: CRNA  Anesthesia Plan Comments:         Anesthesia Quick Evaluation

## 2019-06-18 NOTE — Anesthesia Post-op Follow-up Note (Signed)
Anesthesia QCDR form completed.        

## 2019-06-18 NOTE — Anesthesia Postprocedure Evaluation (Signed)
Anesthesia Post Note  Patient: Janice Tyler  Procedure(s) Performed: COLONOSCOPY WITH PROPOFOL (N/A )  Patient location during evaluation: PACU Anesthesia Type: General Level of consciousness: awake and alert Pain management: pain level controlled Vital Signs Assessment: post-procedure vital signs reviewed and stable Respiratory status: spontaneous breathing, nonlabored ventilation and respiratory function stable Cardiovascular status: blood pressure returned to baseline and stable Postop Assessment: no apparent nausea or vomiting Anesthetic complications: no     Last Vitals:  Vitals:   06/18/19 1604 06/18/19 1614  BP: 133/66 (!) 158/64  Pulse: 61 62  Resp: 17 (!) 21  Temp:    SpO2: 98% 100%    Last Pain:  Vitals:   06/18/19 1615  TempSrc:   PainSc: 0-No pain                 Durenda Hurt

## 2019-06-18 NOTE — H&P (Signed)
Outpatient short stay form Pre-procedure 06/18/2019 2:44 PM Janice Tyler, M.D.  Primary Physician: Frazier Richards, M.D.  Reason for visit: Personal hx of colon polyps  History of present illness:                            Patient presents for colonoscopy for a personal hx of colon polyps. The patient denies abdominal pain, abnormal weight loss or rectal bleeding.     Current Facility-Administered Medications:  .  0.9 %  sodium chloride infusion, , Intravenous, Continuous, [place], Benay Pike, MD, Last Rate: 20 mL/hr at 06/18/19 1439  Medications Prior to Admission  Medication Sig Dispense Refill Last Dose  . acetaminophen (TYLENOL) 500 MG tablet Take 500 mg by mouth every 6 (six) hours as needed.   Past Month at Unknown time  . albuterol (VENTOLIN HFA) 108 (90 Base) MCG/ACT inhaler Inhale 1 puff into the lungs every 6 (six) hours as needed for wheezing or shortness of breath.   Past Month at Unknown time  . amLODipine (NORVASC) 5 MG tablet Take 5 mg by mouth daily.   06/18/2019 at 0800  . amoxicillin (AMOXIL) 500 MG capsule Take 1,000 mg by mouth once. 1 hour prior to dental procedure     . aspirin EC 81 MG tablet Take 81 mg by mouth daily.   06/13/2019  . atorvastatin (LIPITOR) 40 MG tablet Take 40 mg by mouth daily.   06/17/2019 at Unknown time  . enoxaparin (LOVENOX) 60 MG/0.6ML injection Inject 60 mg into the skin every 12 (twelve) hours.   06/17/2019 at 2300  . fluocinolone (VANOS) 0.01 % cream Apply 1 application topically at bedtime.   Past Week at Unknown time  . Fluticasone-Salmeterol (ADVAIR) 250-50 MCG/DOSE AEPB Inhale 1 puff into the lungs 2 (two) times daily.   06/17/2019 at Unknown time  . furosemide (LASIX) 40 MG tablet Take 40 mg by mouth daily.   Past Week at Unknown time  . Hydrocortisone Acetate (ANUSOL HC-1 EX) Apply topically.   06/17/2019 at Unknown time  . lactulose (CHRONULAC) 10 GM/15ML solution Take 30 g by mouth daily.   Past Month at Unknown time  . metoprolol  tartrate (LOPRESSOR) 50 MG tablet Take 50 mg by mouth 2 (two) times daily.   06/18/2019 at 0800  . pantoprazole (PROTONIX) 40 MG tablet Take 40 mg by mouth daily.   06/17/2019 at Unknown time  . warfarin (COUMADIN) 3 MG tablet Take 1.5 mg by mouth daily. Take 5 mg tablet + 1.5 mg (1/2 of 3mg  tablet) for a total dose of 6.5mg  Daily   06/13/2019  . warfarin (COUMADIN) 5 MG tablet Take 5 mg by mouth daily.   06/13/2019  . diclofenac sodium (VOLTAREN) 1 % GEL Apply 2 g topically 2 (two) times daily.   Not Taking at Unknown time  . linaclotide (LINZESS) 72 MCG capsule Take 72 mcg by mouth 3 (three) times a week.   Not Taking at Unknown time     No Known Allergies   Past Medical History:  Diagnosis Date  . CHF (congestive heart failure) ([place])   . Chronic kidney disease    CKD  . COPD (chronic obstructive pulmonary disease) ([place])   . Dysrhythmia    Atrial Fibrillation  . GERD (gastroesophageal reflux disease)   . Hypertension   . Seizures ([place])     Review of systems:  Otherwise negative.    Physical Exam  Gen: Alert, oriented.  Appears stated age.  HEENT: [place]/AT. PERRLA. Lungs: CTA, no wheezes. CV: RR nl S1, S2. Abd: soft, benign, no masses. BS+ Ext: No edema. Pulses 2+    Planned procedures: Proceed with colonoscopy. The patient understands the nature of the planned procedure, indications, risks, alternatives and potential complications including but not limited to bleeding, infection, perforation, damage to internal organs and possible oversedation/side effects from anesthesia. The patient agrees and gives consent to proceed.  Please refer to procedure notes for findings, recommendations and patient disposition/instructions.     Janice Tyler, M.D. Gastroenterology 06/18/2019  2:44 PM

## 2019-06-18 NOTE — Op Note (Signed)
[facility] Patient Name: Janice Tyler Procedure Date: 06/18/2019 3:18 PM MRN: [id number] Account #: [id number] Date of Birth: 1946/04/29 Admit Type: Outpatient Age: 73 Room: [facility] ENDO ROOM 1 Gender: Female Note Status: Finalized Procedure:            Colonoscopy Indications:          High risk colon cancer surveillance: Personal history                        of colonic polyps Providers:            Benay Pike. Alice Reichert MD, MD Referring MD:         Ocie Cornfield. Ouida Sills MD, MD (Referring MD) Medicines:            Propofol per Anesthesia Complications:        No immediate complications. Procedure:            Pre-Anesthesia Assessment:                       - The risks and benefits of the procedure and the                        sedation options and risks were discussed with the                        patient. All questions were answered and informed                        consent was obtained.                       - Patient identification and proposed procedure were                        verified prior to the procedure by the nurse. The                        procedure was verified in the procedure room.                       - ASA Grade Assessment: III - A patient with severe                        systemic disease.                       - After reviewing the risks and benefits, the patient                        was deemed in satisfactory condition to undergo the                        procedure.                       After obtaining informed consent, the colonoscope was                        passed under direct vision. Throughout the procedure,  the patient's blood pressure, pulse, and oxygen                        saturations were monitored continuously. The                        Colonoscope was introduced through the anus and                        advanced to the the cecum, identified by appendiceal      orifice and ileocecal valve. The colonoscopy was                        performed without difficulty. The patient tolerated the                        procedure well. The quality of the bowel preparation                        was adequate. The ileocecal valve, appendiceal orifice,                        and rectum were photographed. Findings:      The perianal examination was normal.      The perianal exam findings include internal hemorrhoids that prolapse       with straining, but require manual replacement into the anal canal       (Grade III).      Multiple small and large-mouthed diverticula were found in the left       colon.      Two sessile polyps were found in the cecum. The polyps were 3 to 4 mm in       size. These polyps were removed with a jumbo cold forceps. Resection and       retrieval were complete.      A 8 mm polyp was found in the descending colon. The polyp was sessile.       The polyp was removed with a cold snare. Resection and retrieval were       complete.      Non-bleeding internal hemorrhoids were found during retroflexion. The       hemorrhoids were Grade III (internal hemorrhoids that prolapse but       require manual reduction).      The exam was otherwise without abnormality. Impression:           - Internal hemorrhoids that prolapse with straining,                        but require manual replacement into the anal canal                        (Grade III) found on perianal exam.                       - Diverticulosis in the left colon.                       - Two 3 to 4 mm polyps in the cecum, removed with a  jumbo cold forceps. Resected and retrieved.                       - One 8 mm polyp in the descending colon, removed with                        a cold snare. Resected and retrieved.                       - Non-bleeding internal hemorrhoids.                       - The examination was otherwise normal. Recommendation:        - Patient has a contact number available for                        emergencies. The signs and symptoms of potential                        delayed complications were discussed with the patient.                        Return to normal activities tomorrow. Written discharge                        instructions were provided to the patient.                       - Resume previous diet.                       - Continue present medications.                       - Resume Coumadin (warfarin) today and Lovenox                        (enoxaparin) today at prior doses. Refer to managing                        physician for further adjustment of therapy.                       - Await pathology results.                       - No repeat colonoscopy due to current age (51 years or                        older).                       - Return to GI office PRN. Procedure Code(s):    --- Professional ---                       [phone], Colonoscopy, flexible; with removal of tumor(s),                        polyp(s), or other lesion(s) by snare technique                       53614, 49,  Colonoscopy, flexible; with biopsy, single                        or multiple Diagnosis Code(s):    --- Professional ---                       K57.30, Diverticulosis of large intestine without                        perforation or abscess without bleeding                       K64.2, Third degree hemorrhoids                       Z86.010, Personal history of colonic polyps                       K63.5, Polyp of colon CPT copyright 2019 American Medical Association. All rights reserved. The codes documented in this report are preliminary and upon coder review may  be revised to meet current compliance requirements. Efrain Sella MD, MD 06/18/2019 3:49:04 PM This report has been signed electronically. Number of Addenda: 0 Note Initiated On: 06/18/2019 3:18 PM Scope Withdrawal Time: 0 hours 7 minutes 28 seconds  Total  Procedure Duration: 0 hours 15 minutes 19 seconds  Estimated Blood Loss: Estimated blood loss was minimal.      [facility]

## 2019-06-18 NOTE — Transfer of Care (Signed)
Immediate Anesthesia Transfer of Care Note  Patient: Shaquilla Kehres  Procedure(s) Performed: COLONOSCOPY WITH PROPOFOL (N/A )  Patient Location: Endoscopy Unit  Anesthesia Type:General  Level of Consciousness: drowsy and responds to stimulation  Airway & Oxygen Therapy: Patient Spontanous Breathing and Patient connected to face mask oxygen  Post-op Assessment: Report given to RN and Post -op Vital signs reviewed and stable  Post vital signs: Reviewed and stable  Last Vitals:  Vitals Value Taken Time  BP 100/48 06/18/19 1545  Temp 36.4 C 06/18/19 1544  Pulse 57 06/18/19 1546  Resp 13 06/18/19 1546  SpO2 100 % 06/18/19 1546  Vitals shown include unvalidated device data.  Last Pain:  Vitals:   06/18/19 1544  TempSrc: Tympanic  PainSc: Asleep         Complications: No apparent anesthesia complications

## 2019-06-18 NOTE — Interval H&P Note (Signed)
History and Physical Interval Note:  06/18/2019 2:44 PM  Janice Tyler  has presented today for surgery, with the diagnosis of Personal History Colon Polyps.  The various methods of treatment have been discussed with the patient and family. After consideration of risks, benefits and other options for treatment, the patient has consented to  Procedure(s): COLONOSCOPY WITH PROPOFOL (N/A) as a surgical intervention.  The patient's history has been reviewed, patient examined, no change in status, stable for surgery.  I have reviewed the patient's chart and labs.  Questions were answered to the patient's satisfaction.     [place], [place]

## 2019-06-19 ENCOUNTER — Encounter: Payer: Self-pay | Admitting: Internal Medicine

## 2019-06-20 LAB — SURGICAL PATHOLOGY

## 2020-03-07 ENCOUNTER — Other Ambulatory Visit: Payer: Self-pay | Admitting: Internal Medicine

## 2020-03-07 DIAGNOSIS — Z1231 Encounter for screening mammogram for malignant neoplasm of breast: Secondary | ICD-10-CM

## 2020-04-21 ENCOUNTER — Ambulatory Visit
Admission: RE | Admit: 2020-04-21 | Discharge: 2020-04-21 | Disposition: A | Discharge status: Home / Self Care | Payer: Federal, State, Local not specified - PPO | Source: Ambulatory Visit | Attending: Internal Medicine | Admitting: Internal Medicine

## 2020-04-21 ENCOUNTER — Other Ambulatory Visit: Payer: Self-pay

## 2020-04-21 DIAGNOSIS — Z1231 Encounter for screening mammogram for malignant neoplasm of breast: Secondary | ICD-10-CM | POA: Diagnosis not present

## 2020-04-24 ENCOUNTER — Telehealth: Payer: Self-pay

## 2020-04-24 DIAGNOSIS — Z87891 Personal history of nicotine dependence: Secondary | ICD-10-CM

## 2020-04-24 DIAGNOSIS — Z122 Encounter for screening for malignant neoplasm of respiratory organs: Secondary | ICD-10-CM

## 2020-04-24 NOTE — Telephone Encounter (Signed)
Patient has been notified that the low dose lung cancer screening CT scan is due currently or will be in near future.  Confirmed that patient is within the appropriate age range and asymptomatic, (no signs or symptoms of lung cancer).  Patient denies illness that would prevent curative treatment for lung cancer if found.  Patient is agreeable for CT scan being scheduled.    Verified smoking history (former smoker, quit 2015 with 42 year 1 ppd history).   CT scan scheduled for 04/30/20 @ 4:30

## 2020-04-25 NOTE — Addendum Note (Signed)
Addended by: Lieutenant Diego on: 04/25/2020 04:35 PM   Modules accepted: Orders

## 2020-04-25 NOTE — Telephone Encounter (Signed)
Smoking history: former, quit 2015, 42 pack year

## 2020-04-30 ENCOUNTER — Other Ambulatory Visit: Payer: Self-pay

## 2020-04-30 ENCOUNTER — Ambulatory Visit
Admission: RE | Admit: 2020-04-30 | Discharge: 2020-04-30 | Disposition: A | Discharge status: Home / Self Care | Payer: Federal, State, Local not specified - PPO | Source: Ambulatory Visit | Attending: Oncology | Admitting: Oncology

## 2020-04-30 DIAGNOSIS — Z122 Encounter for screening for malignant neoplasm of respiratory organs: Secondary | ICD-10-CM

## 2020-04-30 DIAGNOSIS — Z87891 Personal history of nicotine dependence: Secondary | ICD-10-CM | POA: Diagnosis not present

## 2020-05-02 ENCOUNTER — Encounter: Payer: Self-pay | Admitting: *Deleted

## 2020-08-26 ENCOUNTER — Ambulatory Visit (INDEPENDENT_AMBULATORY_CARE_PROVIDER_SITE_OTHER): Payer: Federal, State, Local not specified - PPO | Admitting: Vascular Surgery

## 2020-08-26 ENCOUNTER — Other Ambulatory Visit: Payer: Self-pay

## 2020-08-26 VITALS — BP 145/65 | HR 48 | Ht 62.0 in | Wt 145.0 lb

## 2020-08-26 DIAGNOSIS — J449 Chronic obstructive pulmonary disease, unspecified: Secondary | ICD-10-CM | POA: Insufficient documentation

## 2020-08-26 DIAGNOSIS — M810 Age-related osteoporosis without current pathological fracture: Secondary | ICD-10-CM | POA: Insufficient documentation

## 2020-08-26 DIAGNOSIS — M1990 Unspecified osteoarthritis, unspecified site: Secondary | ICD-10-CM | POA: Insufficient documentation

## 2020-08-26 DIAGNOSIS — I10 Essential (primary) hypertension: Secondary | ICD-10-CM

## 2020-08-26 DIAGNOSIS — E7800 Pure hypercholesterolemia, unspecified: Secondary | ICD-10-CM

## 2020-08-26 DIAGNOSIS — Z9660 Presence of unspecified orthopedic joint implant: Secondary | ICD-10-CM | POA: Insufficient documentation

## 2020-08-26 DIAGNOSIS — Z952 Presence of prosthetic heart valve: Secondary | ICD-10-CM | POA: Insufficient documentation

## 2020-08-26 DIAGNOSIS — E118 Type 2 diabetes mellitus with unspecified complications: Secondary | ICD-10-CM

## 2020-08-26 DIAGNOSIS — K219 Gastro-esophageal reflux disease without esophagitis: Secondary | ICD-10-CM | POA: Insufficient documentation

## 2020-08-26 DIAGNOSIS — R0989 Other specified symptoms and signs involving the circulatory and respiratory systems: Secondary | ICD-10-CM | POA: Insufficient documentation

## 2020-08-26 DIAGNOSIS — G40A19 Absence epileptic syndrome, intractable, without status epilepticus: Secondary | ICD-10-CM | POA: Insufficient documentation

## 2020-08-26 NOTE — Assessment & Plan Note (Signed)
blood pressure control important in reducing the progression of atherosclerotic disease. On appropriate oral medications.  

## 2020-08-26 NOTE — Patient Instructions (Signed)
Carotid Artery Disease  Carotid artery disease, also called carotid artery stenosis, is the narrowing or blockage of one or both carotid arteries. The carotid arteries are the two main blood vessels on either side of the neck. They supply blood to the brain, other parts of the head, and the neck. Carotid artery disease increases your risk for a stroke or a transient ischemic attack (TIA). A TIA is a "mini-stroke" that causes stroke-like symptoms that then go away quickly. What are the causes? This condition is mainly caused by a narrowing and hardening of the carotid arteries (atherosclerosis). The carotid arteries can become narrow or clogged with a buildup of fat, cholesterol, calcium, and other substances (plaque). What increases the risk? The following factors may make you more likely to develop this condition:  Having certain medical conditions, such as: ? High cholesterol. ? High blood pressure (hypertension). ? Diabetes. ? Obesity.  Smoking.  A family history of cardiovascular disease.  Inactivity or lack of regular exercise.  Being female. Men have an increased risk of developing atherosclerosis earlier in life than women.  Old age. What are the signs or symptoms? This condition may not have any signs or symptoms until a stroke or TIA occurs. In some cases, your health care provider may be able to hear a whooshing sound (bruit). This can indicate a change in blood flow caused by plaque buildup. An eye exam can also help identify signs of the condition. How is this diagnosed? This condition may be diagnosed with a physical exam, your medical history, and your family's medical history. You may also have tests that look at the blood flow in your carotid arteries, such as:  Carotid artery ultrasound, which uses sound waves to create pictures to show if the arteries are narrow or blocked.  Tests that use a dye injected into a vein to highlight your arteries on images, such  as: ? Carotid or cerebral angiography, which uses X-rays. ? Computerized tomographic angiography (CTA), which uses CT scans. ? Magnetic resonance angiography (MRA), which uses MRI. How is this treated? This condition may be treated with a combination of treatments. Treatment options include:  Lifestyle changes, such as: ? Quitting smoking. ? Exercising regularly or as told by your health care provider. ? Eating a heart-healthy diet. ? Managing stress. ? Maintaining a healthy weight.  Medicines to control blood pressure, cholesterol, and blood clotting.  Surgery. You may have: ? A carotid endarterectomy. This is a surgery to remove the blockages in the carotid arteries. ? A carotid angioplasty with stenting. This is a procedure in which a small mesh tube (stent) is used to widen the blocked carotid arteries. Follow these instructions at home: Eating and drinking Follow instructions about your diet from your health care provider. It is important to:  Eat a healthy diet that is low in saturated fats and includes plenty of fresh fruits, vegetables, and lean meats.  Avoid foods that are high in fat and salt (sodium).  Avoid foods that are fried, overly processed, or have poor nutritional value.  Lifestyle   Maintain a healthy weight.  Do exercises as told by your health care provider to stay physically active. It is recommended that each week you get at least 150 minutes of moderate-intensity exercise or 75 minutes of exercise that takes a lot of effort.  Do not use any products that contain nicotine or tobacco, such as cigarettes, e-cigarettes, and chewing tobacco. If you need help quitting, ask your health care provider.  Do not drink alcohol if: ? Your health care provider tells you not to drink. ? You are pregnant, may be pregnant, or are planning to become pregnant.  If you drink alcohol: ? Limit how much you use to:  0-1 drink a day for women.  0-2 drinks a day for  men. ? Be aware of how much alcohol is in your drink. In the [place] one drink equals one 12 oz bottle of beer (355 mL), one 5 oz glass of wine (148 mL), or one 1 oz glass of hard liquor (44 mL).  Do not use drugs.  Manage your stress. Ask your health care provider for stress management tips. General instructions  Take over-the-counter and prescription medicines only as told by your health care provider.  Keep all follow-up visits as told by your health care provider. This is important. Where to find more information  American Heart Association: www.heart.org Get help right away if:  You have any symptoms of a stroke. "BE FAST" is an easy way to remember the main warning signs of a stroke: ? B - Balance. Signs are dizziness, sudden trouble walking, or loss of balance. ? E - Eyes. Signs are trouble seeing or a sudden change in vision. ? F - Face. Signs are sudden weakness or numbness of the face, or the face or eyelid drooping on one side. ? A - Arms. Signs are weakness or numbness in an arm. This happens suddenly and usually on one side of the body. ? S - Speech. Signs are sudden trouble speaking, slurred speech, or trouble understanding what people say. ? T - Time. Time to call emergency services. Write down what time symptoms started.  You have other signs of a stroke, such as: ? A sudden, severe headache with no known cause. ? Nausea or vomiting. ? Seizure. These symptoms may represent a serious problem that is an emergency. Do not wait to see if the symptoms will go away. Get medical help right away. Call your local emergency services (911 in the [place] Do not drive yourself to the hospital. Summary  Carotid artery disease, also called carotid artery stenosis, is the narrowing or blockage of one or both carotid arteries.  Carotid artery disease increases your risk for a stroke or a transient ischemic attack (TIA).  This condition can be treated with lifestyle changes, medicines,  surgery, or a combination of these treatments.  Get help right away if you have any symptoms of stroke. The acronym BEFAST is an easy way to remember the main warning signs of stroke. This information is not intended to replace advice given to you by your health care provider. Make sure you discuss any questions you have with your health care provider. Document Revised: 05/07/2019 Document Reviewed: 05/07/2019 Elsevier Patient Education  [place].

## 2020-08-26 NOTE — Assessment & Plan Note (Signed)
blood glucose control important in reducing the progression of atherosclerotic disease. Also, involved in wound healing. On appropriate medications.  

## 2020-08-26 NOTE — Assessment & Plan Note (Signed)
lipid control important in reducing the progression of atherosclerotic disease. Continue statin therapy  

## 2020-08-26 NOTE — Progress Notes (Signed)
Patient ID: Janice Tyler, female   DOB: [birth year], 74 y.o.   MRN: [id number]  Chief Complaint  Patient presents with  . New Patient (Initial Visit)    Manuella Ghazi. carotid bruit     HPI Janice Tyler is a 74 y.o. female.  I am asked to see the patient by Dr. Manuella Ghazi for evaluation of carotid bruits.  The patient has a history of seizure disorder for over 20 years.  There is been some question recently of possible migraine involvement.  The patient reports syncopal episodes on several occasions.  No clear arm or leg weakness or numbness.  No clear speech or swallowing difficulty.  The patient has had a previous aortic valve surgery but no vascular or neck surgery to her knowledge.  She reports being in her usual state of health today.     Past Medical History:  Diagnosis Date  . CHF (congestive heart failure) ([place])   . Chronic kidney disease    CKD  . COPD (chronic obstructive pulmonary disease) ([place])   . Dysrhythmia    Atrial Fibrillation  . GERD (gastroesophageal reflux disease)   . Hypertension   . Seizures ([place])     Past Surgical History:  Procedure Laterality Date  . ABDOMINAL HYSTERECTOMY    . AORTIC VALVE REPLACEMENT    . CARDIAC VALVE REPLACEMENT    . COLONOSCOPY WITH PROPOFOL N/A 06/18/2019   Procedure: COLONOSCOPY WITH PROPOFOL;  Surgeon: Toledo, Benay Pike, MD;  Location: [facility] ENDOSCOPY;  Service: Gastroenterology;  Laterality: N/A;  . OOPHORECTOMY    . REPLACEMENT TOTAL KNEE Bilateral   . TOTAL HIP ARTHROPLASTY Bilateral   . TUBAL LIGATION       Family History  Problem Relation Age of Onset  . Breast cancer Neg Hx   No bleeding disorders, clotting disorders, autoimmune diseases, or aneurysms   Social History   Tobacco Use  . Smoking status: Former Smoker    Packs/day: 1.00    Years: 42.00    Pack years: 42.00    Quit date: 2015    Years since quitting: 6.8  . Smokeless tobacco: Never Used  Substance Use Topics  . Alcohol use: Never  . Drug use: Never     No Known Allergies  Current Outpatient Medications  Medication Sig Dispense Refill  . acetaminophen (TYLENOL) 500 MG tablet Take 500 mg by mouth every 6 (six) hours as needed.    Marland Kitchen albuterol (VENTOLIN HFA) 108 (90 Base) MCG/ACT inhaler Inhale 1 puff into the lungs every 6 (six) hours as needed for wheezing or shortness of breath.    Marland Kitchen amLODipine (NORVASC) 5 MG tablet Take 5 mg by mouth daily.    Marland Kitchen amoxicillin (AMOXIL) 500 MG capsule Take 1,000 mg by mouth once. 1 hour prior to dental procedure    . aspirin EC 81 MG tablet Take 81 mg by mouth daily.    Marland Kitchen atorvastatin (LIPITOR) 40 MG tablet Take 40 mg by mouth daily.    . clobetasol (OLUX) 0.05 % topical foam Apply topically.    . Cyanocobalamin (VITAMIN B12) 1000 MCG TBCR     . fluocinolone (SYNALAR) 0.01 % external solution Apply 1 application topically 2 (two) times daily.    . fluocinolone (VANOS) 0.01 % cream Apply 1 application topically at bedtime.    . furosemide (LASIX) 40 MG tablet Take 40 mg by mouth daily.    Marland Kitchen ketoconazole (NIZORAL) 2 % shampoo Apply 1 application topically 2 (two) times a week.    Marland Kitchen  lactulose, encephalopathy, (ENULOSE) 10 GM/15ML SOLN TAKE 30 MLS BY MOUTH ONCE DAILY    . lamoTRIgine (LAMICTAL) 100 MG tablet Take 100 mg by mouth 2 (two) times daily.    . metoprolol tartrate (LOPRESSOR) 50 MG tablet Take 50 mg by mouth 2 (two) times daily.    . pantoprazole (PROTONIX) 40 MG tablet Take 40 mg by mouth daily.    Marland Kitchen warfarin (COUMADIN) 3 MG tablet Take 1.5 mg by mouth daily. Take 5 mg tablet + 1.5 mg (1/2 of 3mg  tablet) for a total dose of 6.5mg  Daily    . warfarin (COUMADIN) 5 MG tablet Take 5 mg by mouth daily.    . diclofenac sodium (VOLTAREN) 1 % GEL Apply 2 g topically 2 (two) times daily.    Marland Kitchen enoxaparin (LOVENOX) 60 MG/0.6ML injection Inject 60 mg into the skin every 12 (twelve) hours.    . Fluticasone-Salmeterol (ADVAIR) 250-50 MCG/DOSE AEPB Inhale 1 puff into the lungs 2 (two) times daily.    .  Hydrocortisone Acetate (ANUSOL HC-1 EX) Apply topically.    . lactulose (CHRONULAC) 10 GM/15ML solution Take 30 g by mouth daily.    Marland Kitchen linaclotide (LINZESS) 72 MCG capsule Take 72 mcg by mouth 3 (three) times a week.     No current facility-administered medications for this visit.      REVIEW OF SYSTEMS (Negative unless checked)  Constitutional: [] Weight loss  [] Fever  [] Chills Cardiac: [] Chest pain   [] Chest pressure   [x] Palpitations   [] Shortness of breath when laying flat   [] Shortness of breath at rest   [] Shortness of breath with exertion. Vascular:  [] Pain in legs with walking   [] Pain in legs at rest   [] Pain in legs when laying flat   [] Claudication   [] Pain in feet when walking  [] Pain in feet at rest  [] Pain in feet when laying flat   [] History of DVT   [] Phlebitis   [] Swelling in legs   [] Varicose veins   [] Non-healing ulcers Pulmonary:   [] Uses home oxygen   [] Productive cough   [] Hemoptysis   [] Wheeze  [x] COPD   [] Asthma Neurologic:  [] Dizziness  [x] Blackouts   [x] Seizures   [] History of stroke   [] History of TIA  [] Aphasia   [] Temporary blindness   [] Dysphagia   [] Weakness or numbness in arms   [] Weakness or numbness in legs Musculoskeletal:  [x] Arthritis   [] Joint swelling   [] Joint pain   [] Low back pain Hematologic:  [] Easy bruising  [] Easy bleeding   [] Hypercoagulable state   [] Anemic  [] Hepatitis Gastrointestinal:  [] Blood in stool   [] Vomiting blood  [x] Gastroesophageal reflux/heartburn   [] Abdominal pain Genitourinary:  [] Chronic kidney disease   [] Difficult urination  [] Frequent urination  [] Burning with urination   [] Hematuria Skin:  [] Rashes   [] Ulcers   [] Wounds Psychological:  [] History of anxiety   []  History of major depression.    Physical Exam BP (!) 145/65   Pulse (!) 48   Ht 5\' 2"  (1.575 m)   Wt 145 lb (65.8 kg)   BMI 26.52 kg/m  Gen:  WD/WN, NAD.  Appears younger than stated age Head: [place]/AT, No temporalis wasting.  Ear/Nose/Throat: Hearing grossly  intact, nares w/o erythema or drainage, oropharynx w/o Erythema/Exudate Eyes: Conjunctiva clear, sclera non-icteric  Neck: trachea midline.  No JVD.  Soft bilateral carotid bruits Pulmonary:  Good air movement, respirations not labored, no use of accessory muscles  Cardiac: RRR, no JVD.  Aortic murmur present Vascular:  Vessel Right Left  Radial Palpable Palpable                                   Gastrointestinal:. No masses, surgical incisions, or scars. Musculoskeletal: M/S 5/5 throughout.  Extremities without ischemic changes.  No deformity or atrophy.  Trace lower extremity edema. Neurologic: Sensation grossly intact in extremities.  Symmetrical.  Speech is fluent. Motor exam as listed above. Psychiatric: Judgment intact, Mood & affect appropriate for pt's clinical situation. Dermatologic: No rashes or ulcers noted.  No cellulitis or open wounds.    Radiology No results found.  Labs No results found for this or any previous visit (from the past 2160 hour(s)).  Assessment/Plan:  Carotid bruit Although the patient does not have any obvious focal neurologic symptoms, she has had syncopal episodes and with the presence of a bruit I would recommend a carotid duplex for further evaluation.  I have discussed the pathophysiology and natural history of carotid disease.  I discussed that is often silent till it causes stroke or TIA symptoms.  She will return with a carotid duplex in the near future at her convenience.  She is already on aspirin, Coumadin, and a statin agent.  Hypercholesterolemia lipid control important in reducing the progression of atherosclerotic disease. Continue statin therapy   Controlled type 2 diabetes mellitus with complication, without long-term current use of insulin (HCC) blood glucose control important in reducing the progression of atherosclerotic disease. Also, involved in wound healing. On appropriate medications.   Essential (primary)  hypertension blood pressure control important in reducing the progression of atherosclerotic disease. On appropriate oral medications.       Leotis Pain 08/26/2020, 5:14 PM   This note was created with Dragon medical transcription system.  Any errors from dictation are unintentional.

## 2020-08-26 NOTE — Assessment & Plan Note (Signed)
Although the patient does not have any obvious focal neurologic symptoms, she has had syncopal episodes and with the presence of a bruit I would recommend a carotid duplex for further evaluation.  I have discussed the pathophysiology and natural history of carotid disease.  I discussed that is often silent till it causes stroke or TIA symptoms.  She will return with a carotid duplex in the near future at her convenience.  She is already on aspirin, Coumadin, and a statin agent.

## 2020-09-05 ENCOUNTER — Ambulatory Visit (INDEPENDENT_AMBULATORY_CARE_PROVIDER_SITE_OTHER): Payer: Federal, State, Local not specified - PPO | Admitting: Nurse Practitioner

## 2020-09-05 ENCOUNTER — Encounter (INDEPENDENT_AMBULATORY_CARE_PROVIDER_SITE_OTHER): Payer: Federal, State, Local not specified - PPO

## 2020-09-16 ENCOUNTER — Encounter (INDEPENDENT_AMBULATORY_CARE_PROVIDER_SITE_OTHER): Payer: Self-pay | Admitting: Nurse Practitioner

## 2020-09-16 ENCOUNTER — Ambulatory Visit (INDEPENDENT_AMBULATORY_CARE_PROVIDER_SITE_OTHER): Payer: Federal, State, Local not specified - PPO | Admitting: Nurse Practitioner

## 2020-09-16 ENCOUNTER — Ambulatory Visit (INDEPENDENT_AMBULATORY_CARE_PROVIDER_SITE_OTHER): Payer: Federal, State, Local not specified - PPO

## 2020-09-16 ENCOUNTER — Other Ambulatory Visit: Payer: Self-pay

## 2020-09-16 VITALS — BP 146/65 | HR 55 | Ht 62.0 in | Wt 141.0 lb

## 2020-09-16 DIAGNOSIS — I10 Essential (primary) hypertension: Secondary | ICD-10-CM | POA: Diagnosis not present

## 2020-09-16 DIAGNOSIS — E7800 Pure hypercholesterolemia, unspecified: Secondary | ICD-10-CM | POA: Diagnosis not present

## 2020-09-16 DIAGNOSIS — R0989 Other specified symptoms and signs involving the circulatory and respiratory systems: Secondary | ICD-10-CM | POA: Diagnosis not present

## 2020-09-16 DIAGNOSIS — E118 Type 2 diabetes mellitus with unspecified complications: Secondary | ICD-10-CM

## 2020-09-16 NOTE — Progress Notes (Signed)
Subjective:    Patient ID: Janice Tyler, female    DOB: 1946-05-19, 74 y.o.   MRN: [id number] Chief Complaint  Patient presents with  . Follow-up    pt conv carotid u/s    The patient is seen for evaluation of carotid stenosis. The carotid stenosis was identified after bilateral carotid bruits were heard by her cardiologist.  The patient has a previous history of aortic valve surgery.  The patient denies amaurosis fugax. There is no recent history of TIA symptoms or focal motor deficits. There is no prior documented CVA.  There is no history of migraine headaches. There is a history of seizures.  The patient is taking enteric-coated aspirin 81 mg daily.  The patient has a history of coronary artery disease, no recent episodes of angina or shortness of breath. The patient denies PAD or claudication symptoms. There is a history of hyperlipidemia which is being treated with a statin.   Carotid duplex today shows a 1 to 39% stenosis seen bilaterally.  Less than 50% plaque noted in the common carotid artery.  Normal flow hemodynamics in the subclavian arteries as well as vertebral arteries.   Review of Systems  Eyes: Negative for visual disturbance.  All other systems reviewed and are negative.      Objective:   Physical Exam Vitals reviewed.  HENT:     Head: Normocephalic.  Neck:     Vascular: Carotid bruit present.  Cardiovascular:     Rate and Rhythm: Normal rate.     Pulses: Normal pulses.     Heart sounds: Murmur heard.   Pulmonary:     Effort: Pulmonary effort is normal.  Skin:    General: Skin is warm and dry.     Capillary Refill: Capillary refill takes less than 2 seconds.  Neurological:     Mental Status: She is alert and oriented to person, place, and time.  Psychiatric:        Mood and Affect: Mood normal.        Behavior: Behavior normal.        Thought Content: Thought content normal.        Judgment: Judgment normal.     BP (!) 146/65   Pulse (!)  55   Ht 5\' 2"  (1.575 m)   Wt 141 lb (64 kg)   BMI 25.79 kg/m   Past Medical History:  Diagnosis Date  . CHF (congestive heart failure) ([place])   . Chronic kidney disease    CKD  . COPD (chronic obstructive pulmonary disease) ([place])   . Dysrhythmia    Atrial Fibrillation  . GERD (gastroesophageal reflux disease)   . Hypertension   . Seizures ([place])     Social History   Socioeconomic History  . Marital status: Single    Spouse name: Not on file  . Number of children: Not on file  . Years of education: Not on file  . Highest education level: Not on file  Occupational History  . Not on file  Tobacco Use  . Smoking status: Former Smoker    Packs/day: 1.00    Years: 42.00    Pack years: 42.00    Quit date: 2015    Years since quitting: 6.8  . Smokeless tobacco: Never Used  Substance and Sexual Activity  . Alcohol use: Never  . Drug use: Never  . Sexual activity: Not on file  Other Topics Concern  . Not on file  Social History Narrative  . Not on  file   Social Determinants of Health   Financial Resource Strain:   . Difficulty of Paying Living Expenses: Not on file  Food Insecurity:   . Worried About Charity fundraiser in the Last Year: Not on file  . Ran Out of Food in the Last Year: Not on file  Transportation Needs:   . Lack of Transportation (Medical): Not on file  . Lack of Transportation (Non-Medical): Not on file  Physical Activity:   . Days of Exercise per Week: Not on file  . Minutes of Exercise per Session: Not on file  Stress:   . Feeling of Stress : Not on file  Social Connections:   . Frequency of Communication with Friends and Family: Not on file  . Frequency of Social Gatherings with Friends and Family: Not on file  . Attends Religious Services: Not on file  . Active Member of Clubs or Organizations: Not on file  . Attends Archivist Meetings: Not on file  . Marital Status: Not on file  Intimate Partner Violence:   . Fear of Current  or Ex-Partner: Not on file  . Emotionally Abused: Not on file  . Physically Abused: Not on file  . Sexually Abused: Not on file    Past Surgical History:  Procedure Laterality Date  . ABDOMINAL HYSTERECTOMY    . AORTIC VALVE REPLACEMENT    . CARDIAC VALVE REPLACEMENT    . COLONOSCOPY WITH PROPOFOL N/A 06/18/2019   Procedure: COLONOSCOPY WITH PROPOFOL;  Surgeon: Toledo, Benay Pike, MD;  Location: [facility] ENDOSCOPY;  Service: Gastroenterology;  Laterality: N/A;  . OOPHORECTOMY    . REPLACEMENT TOTAL KNEE Bilateral   . TOTAL HIP ARTHROPLASTY Bilateral   . TUBAL LIGATION      Family History  Problem Relation Age of Onset  . Breast cancer Neg Hx     No Known Allergies  CBC Latest Ref Rng & Units 03/17/2017  WBC 3.6 - 11.0 K/uL 5.1  Hemoglobin 12.0 - 16.0 g/dL 12.7  Hematocrit 35 - 47 % 38.6  Platelets 150 - 440 K/uL 156      CMP     Component Value Date/Time   NA 143 03/17/2017 0130   K 3.8 03/17/2017 0130   CL 108 03/17/2017 0130   CO2 27 03/17/2017 0130   GLUCOSE 116 (H) 03/17/2017 0130   BUN 15 03/17/2017 0130   CREATININE 1.09 (H) 03/17/2017 0130   CALCIUM 9.5 03/17/2017 0130   GFRNONAA 49 (L) 03/17/2017 0130   GFRAA 57 (L) 03/17/2017 0130        Assessment & Plan:   1. Bilateral carotid bruits Recommend:  Given the patient's asymptomatic subcritical stenosis no further invasive testing or surgery at this time.  Duplex ultrasound shows <30% stenosis bilaterally.  Continue antiplatelet therapy as prescribed Continue management of CAD, HTN and Hyperlipidemia Healthy heart diet,  encouraged exercise at least 4 times per week Follow up in 24 months with duplex ultrasound and physical exam   2. Hypercholesterolemia Continue statin as ordered and reviewed, no changes at this time   3. Controlled type 2 diabetes mellitus with complication, without long-term current use of insulin (HCC) Continue hypoglycemic medications as already ordered, these medications  have been reviewed and there are no changes at this time.  Hgb A1C to be monitored as already arranged by primary service   4. Essential (primary) hypertension Continue antihypertensive medications as already ordered, these medications have been reviewed and there are no changes at this  time.    Current Outpatient Medications on File Prior to Visit  Medication Sig Dispense Refill  . acetaminophen (TYLENOL) 500 MG tablet Take 500 mg by mouth every 6 (six) hours as needed.    Marland Kitchen albuterol (VENTOLIN HFA) 108 (90 Base) MCG/ACT inhaler Inhale 1 puff into the lungs every 6 (six) hours as needed for wheezing or shortness of breath.    Marland Kitchen amLODipine (NORVASC) 5 MG tablet Take 5 mg by mouth daily.    Marland Kitchen amoxicillin (AMOXIL) 500 MG capsule Take 1,000 mg by mouth once. 1 hour prior to dental procedure    . aspirin EC 81 MG tablet Take 81 mg by mouth daily.    Marland Kitchen atorvastatin (LIPITOR) 40 MG tablet Take 40 mg by mouth daily.    . Cholecalciferol 50 MCG (2000 UT) TABS Take by mouth.    . clobetasol (OLUX) 0.05 % topical foam Apply topically.    . Cyanocobalamin (VITAMIN B12) 1000 MCG TBCR     . fluocinolone (SYNALAR) 0.01 % external solution Apply 1 application topically 2 (two) times daily.    . fluocinolone (VANOS) 0.01 % cream Apply 1 application topically at bedtime.    . furosemide (LASIX) 40 MG tablet Take 40 mg by mouth daily.    Marland Kitchen ketoconazole (NIZORAL) 2 % shampoo Apply 1 application topically 2 (two) times a week.    . lactulose, encephalopathy, (ENULOSE) 10 GM/15ML SOLN TAKE 30 MLS BY MOUTH ONCE DAILY    . lamoTRIgine (LAMICTAL) 100 MG tablet Take 100 mg by mouth 2 (two) times daily.    Marland Kitchen lamoTRIgine (LAMICTAL) 150 MG tablet Take 150 mg by mouth 2 (two) times daily.    . metoprolol tartrate (LOPRESSOR) 50 MG tablet Take 50 mg by mouth 2 (two) times daily.    . pantoprazole (PROTONIX) 40 MG tablet Take 40 mg by mouth daily.    Marland Kitchen warfarin (COUMADIN) 3 MG tablet Take 1.5 mg by mouth daily. Take  5 mg tablet + 1.5 mg (1/2 of 3mg  tablet) for a total dose of 6.5mg  Daily    . warfarin (COUMADIN) 5 MG tablet Take 5 mg by mouth daily.    . diclofenac sodium (VOLTAREN) 1 % GEL Apply 2 g topically 2 (two) times daily.    Marland Kitchen enoxaparin (LOVENOX) 60 MG/0.6ML injection Inject 60 mg into the skin every 12 (twelve) hours.    . Fluticasone-Salmeterol (ADVAIR) 250-50 MCG/DOSE AEPB Inhale 1 puff into the lungs 2 (two) times daily.    . Hydrocortisone Acetate (ANUSOL HC-1 EX) Apply topically.    . lactulose (CHRONULAC) 10 GM/15ML solution Take 30 g by mouth daily.    Marland Kitchen linaclotide (LINZESS) 72 MCG capsule Take 72 mcg by mouth 3 (three) times a week.     No current facility-administered medications on file prior to visit.    There are no Patient Instructions on file for this visit. No follow-ups on file.   Kris Hartmann, NP

## 2021-01-15 ENCOUNTER — Emergency Department: Payer: Medicare Other

## 2021-01-15 ENCOUNTER — Encounter: Payer: Self-pay | Admitting: Emergency Medicine

## 2021-01-15 ENCOUNTER — Inpatient Hospital Stay
Admission: EM | Admit: 2021-01-15 | Discharge: 2021-01-18 | DRG: 091 | Disposition: A | Discharge status: Home Health | Payer: Medicare Other | Attending: Internal Medicine | Admitting: Internal Medicine

## 2021-01-15 ENCOUNTER — Other Ambulatory Visit: Payer: Self-pay

## 2021-01-15 DIAGNOSIS — N183 Chronic kidney disease, stage 3 unspecified: Secondary | ICD-10-CM | POA: Diagnosis present

## 2021-01-15 DIAGNOSIS — T50915A Adverse effect of multiple unspecified drugs, medicaments and biological substances, initial encounter: Secondary | ICD-10-CM | POA: Diagnosis present

## 2021-01-15 DIAGNOSIS — E1122 Type 2 diabetes mellitus with diabetic chronic kidney disease: Secondary | ICD-10-CM | POA: Diagnosis present

## 2021-01-15 DIAGNOSIS — I2510 Atherosclerotic heart disease of native coronary artery without angina pectoris: Secondary | ICD-10-CM | POA: Diagnosis present

## 2021-01-15 DIAGNOSIS — Z96653 Presence of artificial knee joint, bilateral: Secondary | ICD-10-CM | POA: Diagnosis present

## 2021-01-15 DIAGNOSIS — R569 Unspecified convulsions: Secondary | ICD-10-CM

## 2021-01-15 DIAGNOSIS — Z7951 Long term (current) use of inhaled steroids: Secondary | ICD-10-CM

## 2021-01-15 DIAGNOSIS — Z87891 Personal history of nicotine dependence: Secondary | ICD-10-CM

## 2021-01-15 DIAGNOSIS — I10 Essential (primary) hypertension: Secondary | ICD-10-CM | POA: Diagnosis not present

## 2021-01-15 DIAGNOSIS — Z952 Presence of prosthetic heart valve: Secondary | ICD-10-CM | POA: Diagnosis not present

## 2021-01-15 DIAGNOSIS — Z7982 Long term (current) use of aspirin: Secondary | ICD-10-CM

## 2021-01-15 DIAGNOSIS — K219 Gastro-esophageal reflux disease without esophagitis: Secondary | ICD-10-CM | POA: Diagnosis present

## 2021-01-15 DIAGNOSIS — Z7901 Long term (current) use of anticoagulants: Secondary | ICD-10-CM

## 2021-01-15 DIAGNOSIS — R4781 Slurred speech: Secondary | ICD-10-CM | POA: Diagnosis not present

## 2021-01-15 DIAGNOSIS — G40A09 Absence epileptic syndrome, not intractable, without status epilepticus: Secondary | ICD-10-CM | POA: Diagnosis present

## 2021-01-15 DIAGNOSIS — Z20822 Contact with and (suspected) exposure to covid-19: Secondary | ICD-10-CM | POA: Diagnosis present

## 2021-01-15 DIAGNOSIS — Z79899 Other long term (current) drug therapy: Secondary | ICD-10-CM

## 2021-01-15 DIAGNOSIS — I5032 Chronic diastolic (congestive) heart failure: Secondary | ICD-10-CM | POA: Diagnosis not present

## 2021-01-15 DIAGNOSIS — G9341 Metabolic encephalopathy: Secondary | ICD-10-CM | POA: Diagnosis present

## 2021-01-15 DIAGNOSIS — I130 Hypertensive heart and chronic kidney disease with heart failure and stage 1 through stage 4 chronic kidney disease, or unspecified chronic kidney disease: Secondary | ICD-10-CM | POA: Diagnosis present

## 2021-01-15 DIAGNOSIS — Z90710 Acquired absence of both cervix and uterus: Secondary | ICD-10-CM

## 2021-01-15 DIAGNOSIS — N1831 Chronic kidney disease, stage 3a: Secondary | ICD-10-CM | POA: Diagnosis present

## 2021-01-15 DIAGNOSIS — J449 Chronic obstructive pulmonary disease, unspecified: Secondary | ICD-10-CM | POA: Diagnosis present

## 2021-01-15 DIAGNOSIS — I480 Paroxysmal atrial fibrillation: Secondary | ICD-10-CM | POA: Diagnosis present

## 2021-01-15 DIAGNOSIS — I639 Cerebral infarction, unspecified: Secondary | ICD-10-CM | POA: Insufficient documentation

## 2021-01-15 DIAGNOSIS — R791 Abnormal coagulation profile: Secondary | ICD-10-CM | POA: Diagnosis present

## 2021-01-15 DIAGNOSIS — E785 Hyperlipidemia, unspecified: Secondary | ICD-10-CM | POA: Diagnosis present

## 2021-01-15 DIAGNOSIS — Z96643 Presence of artificial hip joint, bilateral: Secondary | ICD-10-CM | POA: Diagnosis present

## 2021-01-15 LAB — CBC WITH DIFFERENTIAL/PLATELET
Abs Immature Granulocytes: 0.03 10*3/uL (ref 0.00–0.07)
Basophils Absolute: 0 10*3/uL (ref 0.0–0.1)
Basophils Relative: 0 %
Eosinophils Absolute: 0.1 10*3/uL (ref 0.0–0.5)
Eosinophils Relative: 2 %
HCT: 39.9 % (ref 36.0–46.0)
Hemoglobin: 12.3 g/dL (ref 12.0–15.0)
Immature Granulocytes: 1 %
Lymphocytes Relative: 12 %
Lymphs Abs: 0.8 10*3/uL (ref 0.7–4.0)
MCH: 25.7 pg — ABNORMAL LOW (ref 26.0–34.0)
MCHC: 30.8 g/dL (ref 30.0–36.0)
MCV: 83.5 fL (ref 80.0–100.0)
Monocytes Absolute: 0.5 10*3/uL (ref 0.1–1.0)
Monocytes Relative: 7 %
Neutro Abs: 5 10*3/uL (ref 1.7–7.7)
Neutrophils Relative %: 78 %
Platelets: 187 10*3/uL (ref 150–400)
RBC: 4.78 MIL/uL (ref 3.87–5.11)
RDW: 13.8 % (ref 11.5–15.5)
WBC: 6.4 10*3/uL (ref 4.0–10.5)
nRBC: 0 % (ref 0.0–0.2)

## 2021-01-15 LAB — BASIC METABOLIC PANEL
Anion gap: 7 (ref 5–15)
BUN: 16 mg/dL (ref 8–23)
CO2: 28 mmol/L (ref 22–32)
Calcium: 9.8 mg/dL (ref 8.9–10.3)
Chloride: 105 mmol/L (ref 98–111)
Creatinine, Ser: 0.9 mg/dL (ref 0.44–1.00)
GFR, Estimated: >60 mL/min (ref >60)
Glucose, Bld: 123 mg/dL — ABNORMAL HIGH (ref 70–99)
Potassium: 3.8 mmol/L (ref 3.5–5.1)
Sodium: 140 mmol/L (ref 135–145)

## 2021-01-15 LAB — BRAIN NATRIURETIC PEPTIDE: B Natriuretic Peptide: 167.7 pg/mL — ABNORMAL HIGH (ref 0.0–100.0)

## 2021-01-15 LAB — APTT: aPTT: 40 seconds — ABNORMAL HIGH (ref 24–36)

## 2021-01-15 LAB — PROTIME-INR
INR: 1.4 — ABNORMAL HIGH (ref 0.8–1.2)
Prothrombin Time: 16.8 seconds — ABNORMAL HIGH (ref 11.4–15.2)

## 2021-01-15 LAB — HEPARIN LEVEL (UNFRACTIONATED): Heparin Unfractionated: 0.32 IU/mL (ref 0.30–0.70)

## 2021-01-15 MED ORDER — DM-GUAIFENESIN ER 30-600 MG PO TB12: 1.0000 | ORAL_TABLET | Freq: Two times a day (BID) | ORAL | Status: DC | PRN

## 2021-01-15 MED ORDER — ACETAMINOPHEN 325 MG PO TABS
650.0000 mg | ORAL_TABLET | ORAL | Status: DC | PRN
  Administered 2021-01-15 – 2021-01-16 (×2): 650 mg via ORAL
  Filled 2021-01-15 (×2): qty 2

## 2021-01-15 MED ORDER — ALBUTEROL SULFATE HFA 108 (90 BASE) MCG/ACT IN AERS
2.0000 | INHALATION_SPRAY | RESPIRATORY_TRACT | Status: DC | PRN
  Filled 2021-01-15: qty 6.7

## 2021-01-15 MED ORDER — ONDANSETRON HCL 4 MG/2ML IJ SOLN
4.0000 mg | Freq: Once | INTRAMUSCULAR | Status: AC
  Administered 2021-01-15: 4 mg via INTRAVENOUS
  Filled 2021-01-15: qty 2

## 2021-01-15 MED ORDER — MORPHINE SULFATE (PF) 4 MG/ML IV SOLN
4.0000 mg | Freq: Once | INTRAVENOUS | Status: AC
  Administered 2021-01-15: 4 mg via INTRAVENOUS
  Filled 2021-01-15: qty 1

## 2021-01-15 MED ORDER — SENNOSIDES-DOCUSATE SODIUM 8.6-50 MG PO TABS: 1.0000 | ORAL_TABLET | Freq: Every evening | ORAL | Status: DC | PRN

## 2021-01-15 MED ORDER — ACETAMINOPHEN 650 MG RE SUPP: 650.0000 mg | RECTAL | Status: DC | PRN

## 2021-01-15 MED ORDER — OXYCODONE-ACETAMINOPHEN 5-325 MG PO TABS: 1.0000 | ORAL_TABLET | ORAL | Status: DC | PRN

## 2021-01-15 MED ORDER — ACETAMINOPHEN 160 MG/5ML PO SOLN
650.0000 mg | ORAL | Status: DC | PRN
  Filled 2021-01-15: qty 20.3

## 2021-01-15 MED ORDER — HEPARIN (PORCINE) 25000 UT/250ML-% IV SOLN
800.0000 [IU]/h | INTRAVENOUS | Status: DC
  Administered 2021-01-15 – 2021-01-17 (×3): 800 [IU]/h via INTRAVENOUS
  Filled 2021-01-15 (×2): qty 250

## 2021-01-15 MED ORDER — LORAZEPAM 2 MG/ML IJ SOLN: 1.0000 mg | INTRAMUSCULAR | Status: DC | PRN

## 2021-01-15 MED ORDER — WARFARIN - PHARMACIST DOSING INPATIENT
Freq: Every day | Status: DC
  Filled 2021-01-15: qty 1

## 2021-01-15 MED ORDER — LAMOTRIGINE 100 MG PO TABS
150.0000 mg | ORAL_TABLET | Freq: Two times a day (BID) | ORAL | Status: DC
  Administered 2021-01-15 – 2021-01-18 (×6): 150 mg via ORAL
  Filled 2021-01-15 (×6): qty 2

## 2021-01-15 MED ORDER — STROKE: EARLY STAGES OF RECOVERY BOOK: Freq: Once | Status: AC

## 2021-01-15 MED ORDER — WARFARIN SODIUM 10 MG PO TABS
10.0000 mg | ORAL_TABLET | Freq: Once | ORAL | Status: AC
  Administered 2021-01-15: 19:00:00 10 mg via ORAL
  Filled 2021-01-15 (×2): qty 1

## 2021-01-15 MED ORDER — ATORVASTATIN CALCIUM 20 MG PO TABS
40.0000 mg | ORAL_TABLET | Freq: Every day | ORAL | Status: DC
  Administered 2021-01-15 – 2021-01-17 (×3): 40 mg via ORAL
  Filled 2021-01-15 (×3): qty 2

## 2021-01-15 MED ORDER — ONDANSETRON HCL 4 MG/2ML IJ SOLN: 4.0000 mg | Freq: Three times a day (TID) | INTRAMUSCULAR | Status: DC | PRN

## 2021-01-15 MED ORDER — WARFARIN SODIUM 10 MG PO TABS
10.0000 mg | ORAL_TABLET | Freq: Once | ORAL | Status: DC
  Filled 2021-01-15: qty 1

## 2021-01-15 NOTE — ED Provider Notes (Signed)
[facility] Emergency Department Provider Note  ____________________________________________   Event Date/Time   First MD Initiated Contact with Patient 01/15/21 1025     (approximate)  I have reviewed the triage vital signs and the nursing notes.   HISTORY  Chief Complaint Aphasia     HPI Janice Tyler is a 75 y.o. female with history of COPD, hypertension, seizures, here with reported altered mental status.  The patient was at pain clinic getting radiofrequency ablation of chronic lumbosacral nerve pain today.  She had her right side done and was getting her left side done.  At that time, she reportedly became less responsive and reportedly was witnessed to develop acute slurred speech.  She then became less responsive.  She has slowly started to return to baseline.  Speech is now improved.  She states that her main issue now is 10 out of 10, sharp, stabbing, burning pain of her left leg.  This radiates down her left leg.  No alleviating factors.  This is the pain that she was having prior to but does seem more severe.  She has a history of absence seizure's with similar presentation in the past.  She is been taking her medications.        Past Medical History:  Diagnosis Date  . CHF (congestive heart failure) ([place])   . Chronic kidney disease    CKD  . COPD (chronic obstructive pulmonary disease) ([place])   . Dysrhythmia    Atrial Fibrillation  . GERD (gastroesophageal reflux disease)   . Hypertension   . Seizures [facility])     Patient Active Problem List   Diagnosis Date Noted  . Stroke ([place]) 01/15/2021  . Slurred speech 01/15/2021  . HLD (hyperlipidemia) 01/15/2021  . CAD (coronary artery disease) 01/15/2021  . GERD (gastroesophageal reflux disease) 01/15/2021  . Seizure ([place]) 01/15/2021  . Chronic diastolic CHF (congestive heart failure) ([place]) 01/15/2021  . Absence seizures, intractable ([place]) 08/26/2020  . Acid reflux disease 08/26/2020  . COPD  (chronic obstructive pulmonary disease) ([place]) 08/26/2020  . 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 Joint replaced 08/26/2020  . 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 Osteoporosis 08/26/2020  . Carotid bruit 08/26/2020  . Use of cane as ambulatory aid 08/23/2018  . Controlled type 2 diabetes mellitus with complication, without long-term current use of insulin ([place]) 02/21/2018  . Nonintractable epilepsy without status epilepticus ([place]) 06/06/2017  . Peripheral neuropathy, idiopathic 06/06/2017  . Aortic atherosclerosis ([place]) 04/12/2017  . Coronary artery disease involving native coronary artery of native heart without angina pectoris 04/12/2017  . Healthcare maintenance 01/06/2017  . Chronic bronchitis ([place]) 09/28/2016  . Essential (primary) hypertension 09/28/2016  . Hypercholesterolemia 09/28/2016  . Incidental lung nodule 09/28/2016  . 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 Urinary incontinence in female 11/08/2008    Past Surgical History:  Procedure Laterality Date  . ABDOMINAL HYSTERECTOMY    . AORTIC VALVE REPLACEMENT    . CARDIAC VALVE REPLACEMENT    . COLONOSCOPY WITH PROPOFOL N/A 06/18/2019   Procedure: COLONOSCOPY WITH PROPOFOL;  Surgeon: Toledo, Benay Pike, MD;  Location: [facility] ENDOSCOPY;  Service: Gastroenterology;  Laterality: N/A;  . OOPHORECTOMY    . REPLACEMENT TOTAL KNEE Bilateral   . TOTAL HIP ARTHROPLASTY Bilateral   . TUBAL LIGATION      Prior to Admission medications   Medication Sig Start Date End Date Taking? Authorizing Provider  acetaminophen (TYLENOL) 500 MG tablet Take 500 mg by  mouth every 6 (six) hours as needed.   Yes [provider]  albuterol (VENTOLIN HFA) 108 (90 Base) MCG/ACT inhaler Inhale 1 puff into the lungs every 6 (six) hours as needed for wheezing or shortness of breath.   Yes [provider]  amLODipine (NORVASC) 5 MG tablet Take 5 mg by mouth daily.   Yes [provider]   aspirin EC 81 MG tablet Take 81 mg by mouth daily.   Yes [provider]  atorvastatin (LIPITOR) 40 MG tablet Take 40 mg by mouth at bedtime.   Yes [provider]  Cholecalciferol 50 MCG (2000 UT) TABS Take by mouth.   Yes [provider]  Cyanocobalamin (VITAMIN B12) 1000 MCG TBCR    Yes [provider]  fluocinolone (SYNALAR) 0.01 % external solution Apply 1 application topically 2 (two) times daily. 06/11/20  Yes [provider]  fluocinolone (VANOS) 0.01 % cream Apply 1 application topically at bedtime.   Yes [provider]  furosemide (LASIX) 40 MG tablet Take 40 mg by mouth daily.   Yes [provider]  ketoconazole (NIZORAL) 2 % shampoo Apply 1 application topically 2 (two) times a week. 08/03/20  Yes [provider]  lamoTRIgine (LAMICTAL) 150 MG tablet Take 150 mg by mouth 2 (two) times daily. 09/15/20  Yes [provider]  metoprolol tartrate (LOPRESSOR) 50 MG tablet Take 50 mg by mouth 2 (two) times daily.   Yes [provider]  pantoprazole (PROTONIX) 40 MG tablet Take 40 mg by mouth daily.   Yes [provider]  TRELEGY ELLIPTA 100-62.5-25 MCG/INH AEPB Inhale 1 puff into the lungs daily. 12/18/20  Yes [provider]  warfarin (COUMADIN) 3 MG tablet Take 6 mg by mouth daily. Take with 1 mg to equal 7 mg   Yes [provider]  warfarin (COUMADIN) 1 MG tablet Take 1 mg by mouth daily. Take with 2 (3 mg) warfarin tablets to equal 7 mg    [provider]    Allergies Patient has no known allergies.  Family History  Problem Relation Age of Onset  . Breast cancer Neg Hx     Social History Social History   Tobacco Use  . Smoking status: Former Smoker    Packs/day: 1.00    Years: 42.00    Pack years: 42.00    Quit date: 2015    Years since quitting: 7.1  . Smokeless tobacco: Never Used  Substance Use Topics  . Alcohol use: Never  . Drug use: Never     Review of Systems  Review of Systems  Constitutional: Positive for fatigue. Negative for chills and fever.  HENT: Negative for sore throat.   Respiratory: Negative for shortness of breath.   Cardiovascular: Negative for chest pain.  Gastrointestinal: Negative for abdominal pain.  Genitourinary: Negative for flank pain.  Musculoskeletal: Negative for neck pain.  Skin: Negative for rash and wound.  Allergic/Immunologic: Negative for immunocompromised state.  Neurological: Positive for speech difficulty. Negative for weakness and numbness.  Hematological: Does not bruise/bleed easily.     ____________________________________________  PHYSICAL EXAM:      VITAL SIGNS: ED Triage Vitals  Enc Vitals Group     BP 01/15/21 1016 (!) 191/101     Pulse Rate 01/15/21 1016 (!) 58     Resp 01/15/21 1016 20     Temp 01/15/21 1016 98 F (36.7 C)     Temp Source 01/15/21 1016 Oral     SpO2  01/15/21 1016 100 %     Weight 01/15/21 1017 145 lb (65.8 kg)     Height 01/15/21 1017 5' 2.5" (1.588 m)     Head Circumference --      Peak Flow --      Pain Score 01/15/21 1017 6     Pain Loc --      Pain Edu? --      Excl. in [place]? --      Physical Exam Vitals and nursing note reviewed.  Constitutional:      General: She is not in acute distress.    Appearance: She is well-developed.  HENT:     Head: Normocephalic and atraumatic.  Eyes:     Conjunctiva/sclera: Conjunctivae normal.  Cardiovascular:     Rate and Rhythm: Normal rate and regular rhythm.     Heart sounds: Normal heart sounds.  Pulmonary:     Effort: Pulmonary effort is normal. No respiratory distress.     Breath sounds: No wheezing.  Abdominal:     General: There is no distension.  Musculoskeletal:     Cervical back: Neck supple.  Skin:    General: Skin is warm.     Capillary Refill: Capillary refill takes less than 2 seconds.     Findings: No rash.  Neurological:     Mental Status: She is alert and oriented to  person, place, and time.     Motor: No abnormal muscle tone.     Comments: Drowsy but follows commands.  Cranial nerves II through XII intact.  Normal sensation to light touch bilateral upper and lower extremities.  Strength 5 5 bilateral upper and lower extremities.  Normal speech.  No aphasia.       ____________________________________________   LABS (all labs ordered are listed, but only abnormal results are displayed)  Labs Reviewed  CBC WITH DIFFERENTIAL/PLATELET - Abnormal; Notable for the following components:      Result Value   MCH 25.7 (*)    All other components within normal limits  BASIC METABOLIC PANEL - Abnormal; Notable for the following components:   Glucose, Bld 123 (*)    All other components within normal limits  PROTIME-INR - Abnormal; Notable for the following components:   Prothrombin Time 16.8 (*)    INR 1.4 (*)    All other components within normal limits  APTT - Abnormal; Notable for the following components:   aPTT 40 (*)    All other components within normal limits  BRAIN NATRIURETIC PEPTIDE - Abnormal; Notable for the following components:   B Natriuretic Peptide 167.7 (*)    All other components within normal limits  SARS CORONAVIRUS 2 (TAT 6-24 HRS)  HEPARIN LEVEL (UNFRACTIONATED)  CBC  HEMOGLOBIN A1C  LIPID PANEL  PROTIME-INR    ____________________________________________  EKG: Normal sinus rhythm, ventricular rate 61.  QRS 97, QTc 418.  No acute ST elevations or depressions.  No EKG evidence of acute ischemia or infarct. ________________________________________  RADIOLOGY All imaging, including plain films, CT scans, and ultrasounds, independently reviewed by me, and interpretations confirmed via formal radiology reads.  ED MD interpretation:   CT head: No acute abnormality MRI brain: Possible acute infarct in corona  Official radiology report(s): CT Head Wo Contrast  Result Date: 01/15/2021 CLINICAL DATA:  76 year old female with  slurred speech, altered mental status. EXAM: CT HEAD WITHOUT CONTRAST TECHNIQUE: Contiguous axial images were obtained from the base of the skull through the vertex without intravenous contrast. COMPARISON:  None. FINDINGS:  Brain: No midline shift, ventriculomegaly, mass effect, evidence of mass lesion, intracranial hemorrhage or evidence of cortically based acute infarction. Gray-white matter differentiation is within normal limits for age in both hemispheres. Some generalized cerebral volume loss but no encephalomalacia identified. Vascular: Calcified atherosclerosis at the skull base. No suspicious intracranial vascular hyperdensity. Skull: Negative. Sinuses/Orbits: Visualized paranasal sinuses and mastoids are clear. Other: Visualized orbits and scalp soft tissues are within normal limits. IMPRESSION: No acute intracranial abnormality. Negative for age non contrast CT appearance of the brain. Electronically Signed   By: Genevie Ann M.D.   On: 01/15/2021 11:45   MR BRAIN WO CONTRAST  Result Date: 01/15/2021 CLINICAL DATA:  Development of slurred speech during medical procedure. Possible seizure. EXAM: MRI HEAD WITHOUT CONTRAST TECHNIQUE: Multiplanar, multiecho pulse sequences of the brain and surrounding structures were obtained without intravenous contrast. COMPARISON:  Head CT same day FINDINGS: Brain: The brainstem is normal. 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 Cerebral hemispheres show mild chronic small-vessel ischemic change of the white matter. Diffusion imaging raises the possibility of a punctate acute white matter infarction in the corona radiography on the left, diffusion axial image 31. This cannot be confirmed on the coronal exam however and is therefore questionable. No cortical or large vessel territory infarction. No mass lesion, hemorrhage, hydrocephalus or extra-axial collection. No postictal phenomenon is visible. Mesial temporal lobes are normal. Vascular: Major vessels at  the base of the brain show flow. Skull and upper cervical spine: Negative Sinuses/Orbits: Clear/normal Other: None IMPRESSION: 1. Question punctate acute white matter infarction in the corona radiography on the left, diffusion axial image 31. This cannot be confirmed on the coronal diffusion exam however and is therefore questionable. 2. Mild chronic small-vessel ischemic change of the cerebellum and cerebral hemispheric white matter. 3. No abnormality seen that would be expected to cause seizure. No evidence of postictal phenomenon. Electronically Signed   By: Nelson Chimes M.D.   On: 01/15/2021 14:09    ____________________________________________  PROCEDURES   Procedure(s) performed (including Critical Care):  Procedures  ____________________________________________  INITIAL IMPRESSION / MDM / [place] / ED COURSE  As part of my medical decision making, I reviewed the following data within the [place] notes reviewed and incorporated, Old chart reviewed, Notes from prior ED visits, and [place] Controlled Substance Database       *Janice Tyler was evaluated in Emergency Department on 01/15/2021 for the symptoms described in the history of present illness. She was evaluated in the context of the global COVID-19 pandemic, which necessitated consideration that the patient might be at risk for infection with the SARS-CoV-2 virus that causes COVID-19. Institutional protocols and algorithms that pertain to the evaluation of patients at risk for COVID-19 are in a state of rapid change based on information released by regulatory bodies including the CDC and federal and state organizations. These policies and algorithms were followed during the patient's care in the ED.  Some ED evaluations and interventions may be delayed as a result of limited staffing during the pandemic.*     Medical Decision Making: 75 year old female here with acute episode of slurred speech and  drowsiness during procedure today.  She did not receive any systemic analgesia. Discussed with Dr. Sharlet Salina.  Patient was finishing up her ablation today when she began to have slurred speech.  She was slightly confused.  Patient slowly came back around and reported that she felt like she had a seizure.  However,  the patient also has a mechanical heart valve with goal INR of 2.5-3.5 and had actually taken vitamin K prior to the procedure, so he was concern for possible stroke.  Clinically, suspect recurrent seizure with postictal phase.  This is similar to her prior seizures.  She has a history of having breakthroughs in the setting of pain or procedures and I suspect this was a provoked seizure.  She has been compliant with her medications and did take her medications this morning.  However, given her mechanical heart valve with subtherapeutic INR, will obtain MRI.   MRI concerning for small ischemic CVA. Pt is subtherapeutic on coumadin. Will place on heparin. Discussed with Dr. Erlinda Hong of Neurology. Admit to Hospitalist.  ____________________________________________  FINAL CLINICAL IMPRESSION(S) / ED DIAGNOSES  Final diagnoses:  Acute ischemic stroke ([place])     MEDICATIONS GIVEN DURING THIS VISIT:  Medications  heparin ADULT infusion 100 units/mL (25000 units/254mL) (800 Units/hr Intravenous New Bag/Given 01/15/21 1545)  LORazepam (ATIVAN) injection 1 mg (has no administration in time range)  albuterol (VENTOLIN HFA) 108 (90 Base) MCG/ACT inhaler 2 puff (has no administration in time range)  dextromethorphan-guaiFENesin (MUCINEX DM) 30-600 MG per 12 hr tablet 1 tablet (has no administration in time range)  ondansetron (ZOFRAN) injection 4 mg (has no administration in time range)   stroke: mapping our early stages of recovery book (has no administration in time range)  acetaminophen (TYLENOL) tablet 650 mg (has no administration in time range)    Or  acetaminophen (TYLENOL) 160 MG/5ML solution  650 mg (has no administration in time range)    Or  acetaminophen (TYLENOL) suppository 650 mg (has no administration in time range)  senna-docusate (Senokot-S) tablet 1 tablet (has no administration in time range)  Warfarin - Pharmacist Dosing Inpatient (has no administration in time range)  atorvastatin (LIPITOR) tablet 40 mg (has no administration in time range)  lamoTRIgine (LAMICTAL) tablet 150 mg (has no administration in time range)  warfarin (COUMADIN) tablet 10 mg (has no administration in time range)  oxyCODONE-acetaminophen (PERCOCET/ROXICET) 5-325 MG per tablet 1 tablet (has no administration in time range)  morphine 4 MG/ML injection 4 mg (4 mg Intravenous Given 01/15/21 1102)  ondansetron (ZOFRAN) injection 4 mg (4 mg Intravenous Given 01/15/21 1102)     ED Discharge Orders    None       Note:  This document was prepared using Dragon voice recognition software and may include unintentional dictation errors.   Duffy Bruce, MD 01/15/21 1728

## 2021-01-15 NOTE — ED Notes (Signed)
Pt transported to CT ?

## 2021-01-15 NOTE — H&P (Addendum)
History and Physical    Dove Gresham WVP:[id number] DOB: 07/16/1946 DOA: 01/15/2021  Referring MD/NP/PA:   PCP: Kirk Ruths, MD   Patient coming from:  The patient is coming from home.  At baseline, pt is independent for most of ADL.        Chief Complaint: Slurred speech  HPI: Alayah Knouff is a 75 y.o. female with medical history significant of hypertension, hyperlipidemia, diabetes mellitus, COPD, GERD, seizure, dCHF, CAD, mechanical aortic valve replacement on Coumadin (goal for INR 2.5-3.5), who presents with slurred speech.  Per her husband (I called her husband by phone), pt was having lumbar radioablation procedure today. During the procedure, she was doing well on the right side but then when moved to the left side at 9am, she had a lot of pain, she was given sedation, then she became slurry speech and became drowsy sleepy but did not loss consciousness. When I saw pt in ED, her mental status is back to normal.  Her slurred speech has resolved.  She states that she has left leg burning pain.  She denies unilateral weakness in extremities.  No facial droop.  Patient does not have chest pain, cough, shortness of breath.  She states that she has intermittent mild diarrhea which has not changed.  Currently no nausea, vomiting, abdominal pain.  No symptoms of UTI. Of note, before the procedure, patient took VitK. Her INR was 1.4 before procedure.   ED Course: pt was found to have INR 1.4, pending COVID-19 PCR, electrolytes renal function okay, temperature normal, blood pressure 191/101, 159/65, heart rate of 48, 63, RR 20, oxygen saturation 82% initially, currently 95-100 % on room air.  CT head is negative for acute intracranial abnormalities. MRI showed left CR questionable punctate DWI restriction.  Patient is placed on MedSurg bed for observation. Dr. Erlinda Hong of neurology is consulted  Review of Systems:   General: no fevers, chills, no body weight gain, fatigue HEENT: no blurry  vision, hearing changes or sore throat Respiratory: no dyspnea, coughing, wheezing CV: no chest pain, no palpitations GI: no nausea, vomiting, abdominal pain, diarrhea, constipation GU: no dysuria, burning on urination, increased urinary frequency, hematuria  Ext: no leg edema Neuro: no unilateral weakness, numbness, or tingling, no vision change or hearing loss. Has slurred speech and confusion Skin: no rash, no skin tear. MSK: has left leg burning pain Heme: No easy bruising.  Travel history: No recent long distant travel.  Allergy: No Known Allergies  Past Medical History:  Diagnosis Date  . CHF (congestive heart failure) ([place])   . Chronic kidney disease    CKD  . COPD (chronic obstructive pulmonary disease) ([place])   . Dysrhythmia    Atrial Fibrillation  . GERD (gastroesophageal reflux disease)   . Hypertension   . Seizures ([place])     Past Surgical History:  Procedure Laterality Date  . ABDOMINAL HYSTERECTOMY    . AORTIC VALVE REPLACEMENT    . CARDIAC VALVE REPLACEMENT    . COLONOSCOPY WITH PROPOFOL N/A 06/18/2019   Procedure: COLONOSCOPY WITH PROPOFOL;  Surgeon: Toledo, Benay Pike, MD;  Location: [facility] ENDOSCOPY;  Service: Gastroenterology;  Laterality: N/A;  . OOPHORECTOMY    . REPLACEMENT TOTAL KNEE Bilateral   . TOTAL HIP ARTHROPLASTY Bilateral   . TUBAL LIGATION      Social History:  reports that she quit smoking about 7 years ago. She has a 42.00 pack-year smoking history. She has never used smokeless tobacco. She reports that she does not  drink alcohol and does not use drugs.  Family History:  Family History  Problem Relation Age of Onset  . Breast cancer Neg Hx      Prior to Admission medications   Medication Sig Start Date End Date Taking? Authorizing Provider  acetaminophen (TYLENOL) 500 MG tablet Take 500 mg by mouth every 6 (six) hours as needed.    [provider]  albuterol (VENTOLIN HFA) 108 (90 Base) MCG/ACT inhaler Inhale 1 puff into the  lungs every 6 (six) hours as needed for wheezing or shortness of breath.    [provider]  amLODipine (NORVASC) 5 MG tablet Take 5 mg by mouth daily.    [provider]  amoxicillin (AMOXIL) 500 MG capsule Take 1,000 mg by mouth once. 1 hour prior to dental procedure    [provider]  aspirin EC 81 MG tablet Take 81 mg by mouth daily.    [provider]  atorvastatin (LIPITOR) 40 MG tablet Take 40 mg by mouth daily.    [provider]  Cholecalciferol 50 MCG (2000 UT) TABS Take by mouth.    [provider]  clobetasol (OLUX) 0.05 % topical foam Apply topically. 08/16/20   [provider]  Cyanocobalamin (VITAMIN B12) 1000 MCG TBCR     [provider]  diclofenac sodium (VOLTAREN) 1 % GEL Apply 2 g topically 2 (two) times daily.    [provider]  enoxaparin (LOVENOX) 60 MG/0.6ML injection Inject 60 mg into the skin every 12 (twelve) hours.    [provider]  fluocinolone (SYNALAR) 0.01 % external solution Apply 1 application topically 2 (two) times daily. 06/11/20   [provider]  fluocinolone (VANOS) 0.01 % cream Apply 1 application topically at bedtime.    [provider]  Fluticasone-Salmeterol (ADVAIR) 250-50 MCG/DOSE AEPB Inhale 1 puff into the lungs 2 (two) times daily.    [provider]  furosemide (LASIX) 40 MG tablet Take 40 mg by mouth daily.    [provider]  Hydrocortisone Acetate (ANUSOL HC-1 EX) Apply topically.    [provider]  ketoconazole (NIZORAL) 2 % shampoo Apply 1 application topically 2 (two) times a week. 08/03/20   [provider]  lactulose (CHRONULAC) 10 GM/15ML solution Take 30 g by mouth daily.    [provider]  lactulose, encephalopathy, (ENULOSE) 10 GM/15ML SOLN TAKE 30 MLS BY MOUTH ONCE DAILY 11/20/18   [provider]  lamoTRIgine (LAMICTAL) 100 MG tablet Take 100 mg by mouth 2 (two) times daily.  06/11/20   [provider]  lamoTRIgine (LAMICTAL) 150 MG tablet Take 150 mg by mouth 2 (two) times daily. 09/15/20   [provider]  linaclotide (LINZESS) 72 MCG capsule Take 72 mcg by mouth 3 (three) times a week.    [provider]  metoprolol tartrate (LOPRESSOR) 50 MG tablet Take 50 mg by mouth 2 (two) times daily.    [provider]  pantoprazole (PROTONIX) 40 MG tablet Take 40 mg by mouth daily.    [provider]  warfarin (COUMADIN) 3 MG tablet Take 1.5 mg by mouth daily. Take 5 mg tablet + 1.5 mg (1/2 of 3mg  tablet) for a total dose of 6.5mg  Daily    [provider]  warfarin (COUMADIN) 5 MG tablet Take 5 mg by mouth daily.    [provider]    Physical Exam: Vitals:   01/18/21 0746 01/18/21 0925 01/18/21 1118 01/18/21 1119  BP: 138/79  Marland Kitchen)  169/69 (!) 156/57  Pulse: (!) 51 (!) 52 (!) 59 (!) 58  Resp: 16  16   Temp: 98.3 F (36.8 C)  97.8 F (36.6 C)   TempSrc:      SpO2: 99%  (!) 89% 97%  Weight:      Height:       General: Not in acute distress HEENT:       Eyes: PERRL, EOMI, no scleral icterus.       ENT: No discharge from the ears and nose, no pharynx injection, no tonsillar enlargement.        Neck: No JVD, no bruit, no mass felt. Heme: No neck lymph node enlargement. Cardiac: S1/S2, RRR, has mechanical valve sound aortic valve area, no gallops or rubs. Respiratory: No rales, wheezing, rhonchi or rubs. GI: Soft, nondistended, nontender, no rebound pain, no organomegaly, BS present. GU: No hematuria Ext: No pitting leg edema bilaterally. 1+DP/PT pulse bilaterally. Musculoskeletal: No joint deformities, No joint redness or warmth, no limitation of ROM in spin. Skin: No rashes.  Neuro: Alert, oriented X3, cranial nerves II-XII grossly intact, moves all extremities normally. Psych: Patient is not psychotic, no suicidal or hemocidal ideation.  Labs on Admission: I have personally reviewed following labs and  imaging studies  CBC: Recent Labs  Lab 01/15/21 1023 01/16/21 0434 01/17/21 0416 01/18/21 0436  WBC 6.4 6.6 7.3 6.6  NEUTROABS 5.0  --   --   --   HGB 12.3 11.4* 11.1* 11.9*  HCT 39.9 36.2 35.7* 37.4  MCV 83.5 82.3 83.6 82.9  PLT 187 168 162 726   Basic Metabolic Panel: Recent Labs  Lab 01/15/21 1023  NA 140  K 3.8  CL 105  CO2 28  GLUCOSE 123*  BUN 16  CREATININE 0.90  CALCIUM 9.8   GFR: Estimated Creatinine Clearance: 47.9 mL/min (by C-G formula based on SCr of 0.9 mg/dL). Liver Function Tests: No results for input(s): AST, ALT, ALKPHOS, BILITOT, PROT, ALBUMIN in the last 168 hours. No results for input(s): LIPASE, AMYLASE in the last 168 hours. No results for input(s): AMMONIA in the last 168 hours. Coagulation Profile: Recent Labs  Lab 01/15/21 1023 01/16/21 0434 01/17/21 0416 01/18/21 0436  INR 1.4* 1.2 1.3* 1.7*   Cardiac Enzymes: No results for input(s): CKTOTAL, CKMB, CKMBINDEX, TROPONINI in the last 168 hours. BNP (last 3 results) No results for input(s): PROBNP in the last 8760 hours. HbA1C: No results for input(s): HGBA1C in the last 72 hours. CBG: No results for input(s): GLUCAP in the last 168 hours. Lipid Profile: No results for input(s): CHOL, HDL, LDLCALC, TRIG, CHOLHDL, LDLDIRECT in the last 72 hours. Thyroid Function Tests: No results for input(s): TSH, T4TOTAL, FREET4, T3FREE, THYROIDAB in the last 72 hours. Anemia Panel: No results for input(s): VITAMINB12, FOLATE, FERRITIN, TIBC, IRON, RETICCTPCT in the last 72 hours. Urine analysis:    Component Value Date/Time   COLORURINE YELLOW (A) 09/30/2018 1148   APPEARANCEUR CLEAR (A) 09/30/2018 1148   LABSPEC 1.020 09/30/2018 1148   PHURINE 5.0 09/30/2018 1148   GLUCOSEU NEGATIVE 09/30/2018 1148   HGBUR NEGATIVE 09/30/2018 1148   BILIRUBINUR NEGATIVE 09/30/2018 1148   KETONESUR NEGATIVE 09/30/2018 1148   PROTEINUR 100 (A) 09/30/2018 1148   NITRITE NEGATIVE 09/30/2018 1148    LEUKOCYTESUR NEGATIVE 09/30/2018 1148   Sepsis Labs: @LABRCNTIP (procalcitonin:4,lacticidven:4) ) Recent Results (from the past 240 hour(s))  SARS CORONAVIRUS 2 (TAT 6-24 HRS) Nasopharyngeal Nasopharyngeal Swab     Status: None   Collection Time: 01/15/21  2:56 PM   Specimen: Nasopharyngeal Swab  Result Value Ref Range Status   SARS Coronavirus 2 NEGATIVE NEGATIVE Final    Comment: (NOTE) SARS-CoV-2 target nucleic acids are NOT DETECTED.  The SARS-CoV-2 RNA is generally detectable in upper and lower respiratory specimens during the acute phase of infection. Negative results do not preclude SARS-CoV-2 infection, do not rule out co-infections with other pathogens, and should not be used as the sole basis for treatment or other patient management decisions. Negative results must be combined with clinical observations, patient history, and epidemiological information. The expected result is Negative.  Fact Sheet for Patients: SugarRoll.be  Fact Sheet for Healthcare Providers: https://www.woods-mathews.com/  This test is not yet approved or cleared by the [place] FDA and  has been authorized for detection and/or diagnosis of SARS-CoV-2 by FDA under an Emergency Use Authorization (EUA). This EUA will remain  in effect (meaning this test can be used) for the duration of the COVID-19 declaration under Se ction 564(b)(1) of the Act, 21 U.S.C. section 360bbb-3(b)(1), unless the authorization is terminated or revoked sooner.  Performed at [facility], [place] [address], [place], [place] [address]      Radiological Exams on Admission: No results found.   EKG: I have personally reviewed.  Sinus rhythm, QTC 418, early R wave progression, no ischemic change   Assessment/Plan Principal Problem:   Acute metabolic encephalopathy Active Problems:   COPD (chronic obstructive pulmonary disease) (H[facility])   Essential (primary)  hypertension   Mechanical heart valve present   Slurred speech   HLD (hyperlipidemia)   CAD (coronary artery disease)   GERD (gastroesophageal reflux disease)   Seizure (HCC)   Chronic diastolic CHF (congestive heart failure) (H[facility])   CKD (chronic kidney disease), stage IIIa   Slurred speech: Etiology is not clear.  MRI of brain showed questionable small stroke.  Dr. Erlinda Hong of neurology is consulted.  - Placed on MedSurg bed for observation - fasting lipid panel and HbA1c  - swallowing screen. If fails, will get SLP - PT/OT consult - heparin IV bridge to coumadin with INR goal 2.5-3.5 - continue home lamictal for seizure prevention - continue home statin - Telemetry monitoring - Frequent neuro checks  COPD (chronic obstructive pulmonary disease) (HCC) -Bronchodilators  Essential (primary) hypertension -IV hydralazine as needed -Metoprolol, amlodipine  Mechanical heart valve present: INR 1.4 -Coumadin per pharmacist for dosing -IV heparin for bridging  HLD (hyperlipidemia) -Lipitor  CAD (coronary artery disease) -Lipitor  GERD (gastroesophageal reflux disease) -Protonix  Seizure -Seizure precaution -When necessary Ativan for seizure -Continue Home medications: Lamictal  Chronic diastolic CHF (congestive heart failure) ([place]): 2D echo 09/20/2018 showed EF> 55%.  Patient does not have respiratory distress.  CHF seem to be compensated. -Continue home Lasix -Check BNP  CKD-IIIa: Cre 1.09, BUN 15. GFR 57. -will f/u with BMP    DVT ppx: On IV heparin and Coumadin Code Status: Full code Family Communication:   Yes, patient's husband by phone   Disposition Plan:  Anticipate discharge back to previous environment Consults called: Neurology, Dr. Erlinda Hong Admission status and Level of care: Med-Surg:  obs        Date of Service 01/22/2021    [facility]   If 7PM-7AM, please contact night-coverage [email or web address] 01/22/2021, 10:25 AM

## 2021-01-15 NOTE — Consult Note (Addendum)
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   Medical History: Past Medical History:  Diagnosis Date  . CHF (congestive heart failure) ([place])   . Chronic kidney disease    CKD  . COPD (chronic obstructive pulmonary disease) ([place])   . Dysrhythmia    Atrial Fibrillation  . GERD (gastroesophageal reflux disease)   . Hypertension   . 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 Brain MRI concerning for acute infarction. Patient with subtherapeutic INR of 1.4. Patient took vitamin K prior to procedure. Pharmacy has been consulted to initiate heparin infusion.  Baseline CBC within normal limits. Baseline INR 1.4. Add-on aPTT pending.   Date INR Plan  3/10 1.4 Warfarin 10 mg    Goal of Therapy:  Heparin level 0.3 - 0.5 units/ml INR 2.5 - 3.5 Monitor platelets by anticoagulation protocol: Yes   Plan:   Heparin --Start IV heparin 800 units/hr, no bolus --Check HL 6 hours after initiation of infusion --Daily CBC per protocol while on IV heparin --Anticipate bridge for approximately 5 days and until INR therapeutic x 2 days  Warfarin --INR is subtherapeutic. Will give warfarin 10 mg x 1 tonight --Daily INR  per protocol  Benita Gutter 01/15/2021,2:28 PM

## 2021-01-15 NOTE — Consult Note (Addendum)
Stroke Neurology Consultation Note  Consult Requested by: Dr. Blaine Hamper  Reason for Consult: stroke  Consult Date: 01/15/21   The history was obtained from the pt.  During history and examination, all items were able to obtain unless otherwise noted.  History of Present Illness:  Janice Tyler is a 75 y.o. 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   Per pt and wife, pt was having lumbar radioablation procedure today. Her INR two days ago was 3.3 and the procedurelist would like her to be < 3.0. She was told to eat some greens yesterday but pt self also took VitK. She did not take coumadin as instructed yesterday. Today her INR was 1.4 before procedure. During the procedure, she was doing well on the right side but then when move to the left side at 9am, she had a lot of pain, she was given sedation, then she became slurry speech and became drowsy sleepy but did not loss consciousness. Symptoms lasted about 15-66min and resolved. She was sent to ER for evaluation, initially she still has some sleepiness but on my exam, she is at her baseline. MRI showed left CR questionable punctate DWI restriction. Neurology was called for evaluation  LSN: 9am tPA Given: No: outside window and MRI only showed punctate possible stroke  Past Medical History:  Diagnosis Date  . CHF (congestive heart failure) ([place])   . Chronic kidney disease    CKD  . COPD (chronic obstructive pulmonary disease) ([place])   . Dysrhythmia    Atrial Fibrillation  . GERD (gastroesophageal reflux disease)   . Hypertension   . Seizures ([place])     Past Surgical History:  Procedure Laterality Date  . ABDOMINAL HYSTERECTOMY    . AORTIC VALVE REPLACEMENT    . CARDIAC VALVE REPLACEMENT    . COLONOSCOPY WITH PROPOFOL N/A 06/18/2019   Procedure: COLONOSCOPY  WITH PROPOFOL;  Surgeon: Toledo, Benay Pike, MD;  Location: [facility] ENDOSCOPY;  Service: Gastroenterology;  Laterality: N/A;  . OOPHORECTOMY    . REPLACEMENT TOTAL KNEE Bilateral   . TOTAL HIP ARTHROPLASTY Bilateral   . TUBAL LIGATION      Family History  Problem Relation Age of Onset  . Breast cancer Neg Hx     Social History:  reports that she quit smoking about 7 years ago. She has a 42.00 pack-year smoking history. She has never used smokeless tobacco. She reports that she does not drink alcohol and does not use drugs.  Allergies: No Known Allergies  No current facility-administered medications on file prior to encounter.   Current Outpatient Medications on File Prior to Encounter  Medication Sig Dispense Refill  . acetaminophen (TYLENOL) 500 MG tablet Take 500 mg by mouth every 6 (six) hours as needed.    Marland Kitchen albuterol (VENTOLIN HFA) 108 (90 Base) MCG/ACT inhaler Inhale 1 puff into the lungs every 6 (six) hours as needed for wheezing or shortness of breath.    Marland Kitchen amLODipine (NORVASC) 5 MG tablet Take 5 mg by mouth daily.    Marland Kitchen amoxicillin (AMOXIL) 500 MG capsule Take 1,000 mg by mouth once. 1 hour prior to dental procedure    . aspirin EC 81 MG tablet Take 81 mg by mouth daily.    Marland Kitchen atorvastatin (LIPITOR) 40 MG tablet Take 40 mg by mouth daily.    Marland Kitchen  Cholecalciferol 50 MCG (2000 UT) TABS Take by mouth.    . clobetasol (OLUX) 0.05 % topical foam Apply topically.    . Cyanocobalamin (VITAMIN B12) 1000 MCG TBCR     . diclofenac sodium (VOLTAREN) 1 % GEL Apply 2 g topically 2 (two) times daily.    Marland Kitchen enoxaparin (LOVENOX) 60 MG/0.6ML injection Inject 60 mg into the skin every 12 (twelve) hours.    . fluocinolone (SYNALAR) 0.01 % external solution Apply 1 application topically 2 (two) times daily.    . fluocinolone (VANOS) 0.01 % cream Apply 1 application topically at bedtime.    . Fluticasone-Salmeterol (ADVAIR) 250-50 MCG/DOSE AEPB Inhale 1 puff into the lungs 2 (two) times daily.    .  furosemide (LASIX) 40 MG tablet Take 40 mg by mouth daily.    . Hydrocortisone Acetate (ANUSOL HC-1 EX) Apply topically.    Marland Kitchen ketoconazole (NIZORAL) 2 % shampoo Apply 1 application topically 2 (two) times a week.    . lactulose (CHRONULAC) 10 GM/15ML solution Take 30 g by mouth daily.    Marland Kitchen lactulose, encephalopathy, (ENULOSE) 10 GM/15ML SOLN TAKE 30 MLS BY MOUTH ONCE DAILY    . lamoTRIgine (LAMICTAL) 100 MG tablet Take 100 mg by mouth 2 (two) times daily.    Marland Kitchen lamoTRIgine (LAMICTAL) 150 MG tablet Take 150 mg by mouth 2 (two) times daily.    Marland Kitchen linaclotide (LINZESS) 72 MCG capsule Take 72 mcg by mouth 3 (three) times a week.    . metoprolol tartrate (LOPRESSOR) 50 MG tablet Take 50 mg by mouth 2 (two) times daily.    . pantoprazole (PROTONIX) 40 MG tablet Take 40 mg by mouth daily.    Marland Kitchen warfarin (COUMADIN) 3 MG tablet Take 1.5 mg by mouth daily. Take 5 mg tablet + 1.5 mg (1/2 of 3mg  tablet) for a total dose of 6.5mg  Daily    . warfarin (COUMADIN) 5 MG tablet Take 5 mg by mouth daily.      Review of Systems: A full ROS was attempted today and was able to be performed.  Systems assessed include - Constitutional, Eyes, HENT, Respiratory, Cardiovascular, Gastrointestinal, Genitourinary, Integument/breast, Hematologic/lymphatic, Musculoskeletal, Neurological, Behavioral/Psych, Endocrine, Allergic/Immunologic - with pertinent responses as per HPI.  Physical Examination: Temp:  [98 F (36.7 C)] 98 F (36.7 C) (03/10 1016) Pulse Rate:  [48-63] 55 (03/10 1425) Resp:  [11-20] 11 (03/10 1425) BP: (135-191)/(56-110) 160/110 (03/10 1425) SpO2:  [88 %-100 %] 94 % (03/10 1425) Weight:  [65.8 kg] 65.8 kg (03/10 1017)  General - well nourished, well developed, in no apparent distress.    Ophthalmologic - fundi not visualized due to noncooperation.    Cardiovascular - regular rhythm and rate  Mental Status -  Level of arousal and orientation to time, place, and person were intact. Language including  expression, naming, repetition, comprehension, reading, and writing was assessed and found intact. Fund of Knowledge was assessed and was intact.  Cranial Nerves II - XII - II - Vision intact OU. III, IV, VI - Extraocular movements intact. V - Facial sensation intact bilaterally. VII - Facial movement intact bilaterally. VIII - Hearing & vestibular intact bilaterally. X - Palate elevates symmetrically. XI - Chin turning & shoulder shrug intact bilaterally. XII - Tongue protrusion intact.  Motor Strength - The patient's strength was normal in all extremities and pronator drift was absent except left LE proximal 3-/5 due to pain on movement.   Motor Tone & Bulk - Muscle tone was assessed at the neck  and appendages and was normal.  Bulk was normal and fasciculations were absent.   Reflexes - The patient's reflexes were normal in all extremities and she had no pathological reflexes.  Sensory - Light touch, temperature/pinprick were assessed and were normal.    Coordination - The patient had normal movements in the hands with no ataxia or dysmetria.  Tremor was absent.  Gait and Station - deferred  Data Reviewed: CT Head Wo Contrast  Result Date: 01/15/2021 CLINICAL DATA:  75 year old female with slurred speech, altered mental status. EXAM: CT HEAD WITHOUT CONTRAST TECHNIQUE: Contiguous axial images were obtained from the base of the skull through the vertex without intravenous contrast. COMPARISON:  None. FINDINGS: Brain: No midline shift, ventriculomegaly, mass effect, evidence of mass lesion, intracranial hemorrhage or evidence of cortically based acute infarction. Gray-white matter differentiation is within normal limits for age in both hemispheres. Some generalized cerebral volume loss but no encephalomalacia identified. Vascular: Calcified atherosclerosis at the skull base. No suspicious intracranial vascular hyperdensity. Skull: Negative. Sinuses/Orbits: Visualized paranasal sinuses and  mastoids are clear. Other: Visualized orbits and scalp soft tissues are within normal limits. IMPRESSION: No acute intracranial abnormality. Negative for age non contrast CT appearance of the brain. Electronically Signed   By: Genevie Ann M.D.   On: 01/15/2021 11:45   MR BRAIN WO CONTRAST  Result Date: 01/15/2021 CLINICAL DATA:  Development of slurred speech during medical procedure. Possible seizure. EXAM: MRI HEAD WITHOUT CONTRAST TECHNIQUE: Multiplanar, multiecho pulse sequences of the brain and surrounding structures were obtained without intravenous contrast. COMPARISON:  Head CT same day FINDINGS: Brain: The brainstem is normal. 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 Cerebral hemispheres show mild chronic small-vessel ischemic change of the white matter. Diffusion imaging raises the possibility of a punctate acute white matter infarction in the corona radiography on the left, diffusion axial image 31. This cannot be confirmed on the coronal exam however and is therefore questionable. No cortical or large vessel territory infarction. No mass lesion, hemorrhage, hydrocephalus or extra-axial collection. No postictal phenomenon is visible. Mesial temporal lobes are normal. Vascular: Major vessels at the base of the brain show flow. Skull and upper cervical spine: Negative Sinuses/Orbits: Clear/normal Other: None IMPRESSION: 1. Question punctate acute white matter infarction in the corona radiography on the left, diffusion axial image 31. This cannot be confirmed on the coronal diffusion exam however and is therefore questionable. 2. Mild chronic small-vessel ischemic change of the cerebellum and cerebral hemispheric white matter. 3. No abnormality seen that would be expected to cause seizure. No evidence of postictal phenomenon. Electronically Signed   By: Nelson Chimes M.D.   On: 01/15/2021 14:09    Assessment: 75 y.o. female with PMH of MAVR in 2002 on coumadin with INR goal 2.5-3.5, PAF, HLD  on lipitor, HTN, CHF, DM, seizure disorder on lamictal presented to ER for slurry speech and decreased consciousness during procedure. Time onset 9am. Her symptoms more likely related to her sedation given for her pain during the procedure. However, her INR was low, and MRI questionable punctate left CR DWI restriction, there is concern for potential embolic phenomenon. Will recommend heparin IV to bridge coumadin with INR goal 2.5-3.5. Do not feel formal stroke work up needed at this time, as it will not change management. Pt will need close follow up with her cardiology after discharge.  Stroke Risk Factors - atrial fibrillation and MAVR, HTN, DM, HLD  Plan: - HgbA1c, fasting lipid panel - PT consult, OT  consult - heparin IV bridge to coumadin with INR goal 2.5-3.5 - continue home lamictal for seizure prevention - continue home statin - Risk factor modification - Telemetry monitoring - Frequent neuro checks - continue to follow up with cardiology and Dr. Manuella Ghazi neurology on discharge. - will follow  Thank you for this consultation and allowing [place] to participate in the care of this patient.  Rosalin Hawking, MD PhD Stroke Neurology 01/15/2021 3:55 PM

## 2021-01-15 NOTE — ED Triage Notes (Signed)
Patient to ER from [facility] for c/o slurred speech during "nerve burn procedure". Patient's INR was 1.3 per MD at [facility]. Patient has h/o seizures and states she feels like this may be her seizures, that she is "coming out of it". Patient sent for further neuro eval.

## 2021-01-15 NOTE — Consult Note (Signed)
[place] for warfarin with heparin infusion to bridge Indication: Embolic stroke / Afib / hx MAVR  Patient Measurements: Heparin Dosing Weight: 64.6 kg  Labs: Recent Labs    01/15/21 1023 01/15/21 2152  HGB 12.3  --   HCT 39.9  --   PLT 187  --   APTT 40*  --   LABPROT 16.8*  --   INR 1.4*  --   HEPARINUNFRC  --  0.32  CREATININE 0.90  --     Estimated Creatinine Clearance: 47.9 mL/min (by C-G formula based on SCr of 0.9 mg/dL).   Medical History: Past Medical History:  Diagnosis Date  . CHF (congestive heart failure) ([place])   . Chronic kidney disease    CKD  . COPD (chronic obstructive pulmonary disease) ([place])   . Dysrhythmia    Atrial Fibrillation  . GERD (gastroesophageal reflux disease)   . Hypertension   . 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 Brain MRI concerning for acute infarction. Patient with subtherapeutic INR of 1.4. Patient took vitamin K prior to procedure. Pharmacy has been consulted to initiate heparin infusion.  Baseline CBC within normal limits. Baseline INR 1.4. Add-on aPTT pending.   Date INR Plan  3/10 1.4 Warfarin 10 mg    Goal of Therapy:  Heparin level 0.3 - 0.5 units/ml INR 2.5 - 3.5 Monitor platelets by anticoagulation protocol: Yes   Plan:   Warfarin --INR is subtherapeutic. Will give warfarin 10 mg x 1 tonight --Daily INR per protocol  Heparin  03/10 2152 HL 0.32, therapeutic x 1   Plan:  --Continue heparin at 800 units/hr --Recheck HL 6 with AM labs to  confirm --Daily CBC per protocol while on IV heparin --Anticipate bridge for approximately 5 days and until INR therapeutic x 2 days  Renda Rolls, PharmD, [facility] 01/15/2021 11:02 PM

## 2021-01-16 DIAGNOSIS — I130 Hypertensive heart and chronic kidney disease with heart failure and stage 1 through stage 4 chronic kidney disease, or unspecified chronic kidney disease: Secondary | ICD-10-CM | POA: Diagnosis present

## 2021-01-16 DIAGNOSIS — J449 Chronic obstructive pulmonary disease, unspecified: Secondary | ICD-10-CM | POA: Diagnosis present

## 2021-01-16 DIAGNOSIS — Z79899 Other long term (current) drug therapy: Secondary | ICD-10-CM | POA: Diagnosis not present

## 2021-01-16 DIAGNOSIS — Z7982 Long term (current) use of aspirin: Secondary | ICD-10-CM | POA: Diagnosis not present

## 2021-01-16 DIAGNOSIS — Z20822 Contact with and (suspected) exposure to covid-19: Secondary | ICD-10-CM | POA: Diagnosis present

## 2021-01-16 DIAGNOSIS — Z90710 Acquired absence of both cervix and uterus: Secondary | ICD-10-CM | POA: Diagnosis not present

## 2021-01-16 DIAGNOSIS — R791 Abnormal coagulation profile: Secondary | ICD-10-CM | POA: Diagnosis present

## 2021-01-16 DIAGNOSIS — Z87891 Personal history of nicotine dependence: Secondary | ICD-10-CM | POA: Diagnosis not present

## 2021-01-16 DIAGNOSIS — K219 Gastro-esophageal reflux disease without esophagitis: Secondary | ICD-10-CM | POA: Diagnosis present

## 2021-01-16 DIAGNOSIS — E785 Hyperlipidemia, unspecified: Secondary | ICD-10-CM | POA: Diagnosis present

## 2021-01-16 DIAGNOSIS — I639 Cerebral infarction, unspecified: Secondary | ICD-10-CM

## 2021-01-16 DIAGNOSIS — I10 Essential (primary) hypertension: Secondary | ICD-10-CM | POA: Diagnosis not present

## 2021-01-16 DIAGNOSIS — Z952 Presence of prosthetic heart valve: Secondary | ICD-10-CM

## 2021-01-16 DIAGNOSIS — N1831 Chronic kidney disease, stage 3a: Secondary | ICD-10-CM | POA: Diagnosis present

## 2021-01-16 DIAGNOSIS — Z96653 Presence of artificial knee joint, bilateral: Secondary | ICD-10-CM | POA: Diagnosis present

## 2021-01-16 DIAGNOSIS — R4781 Slurred speech: Secondary | ICD-10-CM | POA: Diagnosis present

## 2021-01-16 DIAGNOSIS — I480 Paroxysmal atrial fibrillation: Secondary | ICD-10-CM | POA: Diagnosis present

## 2021-01-16 DIAGNOSIS — Z7901 Long term (current) use of anticoagulants: Secondary | ICD-10-CM | POA: Diagnosis not present

## 2021-01-16 DIAGNOSIS — I2510 Atherosclerotic heart disease of native coronary artery without angina pectoris: Secondary | ICD-10-CM | POA: Diagnosis present

## 2021-01-16 DIAGNOSIS — I5032 Chronic diastolic (congestive) heart failure: Secondary | ICD-10-CM

## 2021-01-16 DIAGNOSIS — Z7951 Long term (current) use of inhaled steroids: Secondary | ICD-10-CM | POA: Diagnosis not present

## 2021-01-16 DIAGNOSIS — E7800 Pure hypercholesterolemia, unspecified: Secondary | ICD-10-CM | POA: Diagnosis not present

## 2021-01-16 DIAGNOSIS — R569 Unspecified convulsions: Secondary | ICD-10-CM | POA: Diagnosis not present

## 2021-01-16 DIAGNOSIS — G40A09 Absence epileptic syndrome, not intractable, without status epilepticus: Secondary | ICD-10-CM | POA: Diagnosis present

## 2021-01-16 DIAGNOSIS — T50915A Adverse effect of multiple unspecified drugs, medicaments and biological substances, initial encounter: Secondary | ICD-10-CM | POA: Diagnosis present

## 2021-01-16 DIAGNOSIS — Z96643 Presence of artificial hip joint, bilateral: Secondary | ICD-10-CM | POA: Diagnosis present

## 2021-01-16 DIAGNOSIS — E1122 Type 2 diabetes mellitus with diabetic chronic kidney disease: Secondary | ICD-10-CM | POA: Diagnosis present

## 2021-01-16 DIAGNOSIS — G9341 Metabolic encephalopathy: Secondary | ICD-10-CM | POA: Diagnosis present

## 2021-01-16 LAB — PROTIME-INR
INR: 1.2 (ref 0.8–1.2)
Prothrombin Time: 15.2 seconds (ref 11.4–15.2)

## 2021-01-16 LAB — HEMOGLOBIN A1C
Hgb A1c MFr Bld: 6.4 % — ABNORMAL HIGH (ref 4.8–5.6)
Mean Plasma Glucose: 136.98 mg/dL

## 2021-01-16 LAB — LIPID PANEL
Cholesterol: 148 mg/dL (ref 0–200)
HDL: 76 mg/dL (ref >40)
LDL Cholesterol: 62 mg/dL (ref 0–99)
Total CHOL/HDL Ratio: 1.9 RATIO
Triglycerides: 51 mg/dL (ref <150)
VLDL: 10 mg/dL (ref 0–40)

## 2021-01-16 LAB — CBC
HCT: 36.2 % (ref 36.0–46.0)
Hemoglobin: 11.4 g/dL — ABNORMAL LOW (ref 12.0–15.0)
MCH: 25.9 pg — ABNORMAL LOW (ref 26.0–34.0)
MCHC: 31.5 g/dL (ref 30.0–36.0)
MCV: 82.3 fL (ref 80.0–100.0)
Platelets: 168 10*3/uL (ref 150–400)
RBC: 4.4 MIL/uL (ref 3.87–5.11)
RDW: 13.8 % (ref 11.5–15.5)
WBC: 6.6 10*3/uL (ref 4.0–10.5)
nRBC: 0 % (ref 0.0–0.2)

## 2021-01-16 LAB — SARS CORONAVIRUS 2 (TAT 6-24 HRS): SARS Coronavirus 2: NEGATIVE

## 2021-01-16 LAB — HEPARIN LEVEL (UNFRACTIONATED): Heparin Unfractionated: 0.58 IU/mL (ref 0.30–0.70)

## 2021-01-16 MED ORDER — VITAMIN D 25 MCG (1000 UNIT) PO TABS
2000.0000 [IU] | ORAL_TABLET | Freq: Every day | ORAL | Status: DC
  Administered 2021-01-16 – 2021-01-18 (×3): 2000 [IU] via ORAL
  Filled 2021-01-16 (×3): qty 2

## 2021-01-16 MED ORDER — AMLODIPINE BESYLATE 5 MG PO TABS
5.0000 mg | ORAL_TABLET | Freq: Every day | ORAL | Status: DC
Start: 2021-01-16 — End: 2021-01-18
  Administered 2021-01-16 – 2021-01-18 (×3): 5 mg via ORAL
  Filled 2021-01-16 (×3): qty 1

## 2021-01-16 MED ORDER — FLUTICASONE-UMECLIDIN-VILANT 100-62.5-25 MCG/INH IN AEPB: 1.0000 | INHALATION_SPRAY | Freq: Every day | RESPIRATORY_TRACT | Status: DC

## 2021-01-16 MED ORDER — HYDRALAZINE HCL 20 MG/ML IJ SOLN: 5.0000 mg | INTRAMUSCULAR | Status: DC | PRN

## 2021-01-16 MED ORDER — PANTOPRAZOLE SODIUM 40 MG PO TBEC
40.0000 mg | DELAYED_RELEASE_TABLET | Freq: Every day | ORAL | Status: DC
Start: 2021-01-16 — End: 2021-01-18
  Administered 2021-01-16 – 2021-01-18 (×3): 40 mg via ORAL
  Filled 2021-01-16 (×3): qty 1

## 2021-01-16 MED ORDER — FLUTICASONE FUROATE-VILANTEROL 100-25 MCG/INH IN AEPB
1.0000 | INHALATION_SPRAY | Freq: Every day | RESPIRATORY_TRACT | Status: DC
  Administered 2021-01-17 – 2021-01-18 (×2): 1 via RESPIRATORY_TRACT
  Filled 2021-01-16: qty 28

## 2021-01-16 MED ORDER — WARFARIN SODIUM 10 MG PO TABS
10.0000 mg | ORAL_TABLET | Freq: Once | ORAL | Status: AC
  Administered 2021-01-16: 22:00:00 10 mg via ORAL
  Filled 2021-01-16: qty 1

## 2021-01-16 MED ORDER — FUROSEMIDE 40 MG PO TABS
40.0000 mg | ORAL_TABLET | Freq: Every day | ORAL | Status: DC
  Administered 2021-01-17 – 2021-01-18 (×2): 40 mg via ORAL
  Filled 2021-01-16 (×3): qty 1

## 2021-01-16 MED ORDER — ASPIRIN EC 81 MG PO TBEC
81.0000 mg | DELAYED_RELEASE_TABLET | Freq: Every day | ORAL | Status: DC
  Administered 2021-01-16 – 2021-01-18 (×3): 81 mg via ORAL
  Filled 2021-01-16 (×3): qty 1

## 2021-01-16 MED ORDER — METOPROLOL TARTRATE 50 MG PO TABS
50.0000 mg | ORAL_TABLET | Freq: Two times a day (BID) | ORAL | Status: DC
  Administered 2021-01-16 – 2021-01-17 (×2): 50 mg via ORAL
  Filled 2021-01-16 (×3): qty 1

## 2021-01-16 MED ORDER — FLUOCINOLONE ACETONIDE 0.01 % EX SOLN: 1.0000 "application " | Freq: Two times a day (BID) | CUTANEOUS | Status: DC

## 2021-01-16 MED ORDER — ALBUTEROL SULFATE HFA 108 (90 BASE) MCG/ACT IN AERS: 1.0000 | INHALATION_SPRAY | Freq: Four times a day (QID) | RESPIRATORY_TRACT | Status: DC | PRN

## 2021-01-16 MED ORDER — UMECLIDINIUM BROMIDE 62.5 MCG/INH IN AEPB
1.0000 | INHALATION_SPRAY | Freq: Every day | RESPIRATORY_TRACT | Status: DC
  Administered 2021-01-17 – 2021-01-18 (×2): 1 via RESPIRATORY_TRACT
  Filled 2021-01-16: qty 7

## 2021-01-16 NOTE — Evaluation (Signed)
Occupational Therapy Evaluation Patient Details Name: Janice Tyler MRN: [id number] DOB: 12/16/1945 Today's Date: 01/16/2021    History of Present Illness Janice Tyler is a 75 y.o. female who developed slurred speech, AMS, and L LE pain while undergoing a lumbar radioablation procedure. PMHx includes hypertension, hyperlipidemia, diabetes mellitus, COPD, GERD, seizure, dCHF, CAD, mechanical aortic valve replacement on Coumadin.   Clinical Impression   Janice Tyler presents today with reduced functional mobility, L hip/leg pain (5/10), headache, dizziness, lightheadedness. Slurred speech and AMS resolved. PTA, pt is IND in ADL/IADL, using a SPC for community ambulation, no AD w/in the home. Janice Tyler drives short distances, reports that she enjoys going out for [employer] and shopping for clothes. She and her husband share household tasks. During today's evaluation, pt is oriented and alert, pleasant, demonstrates good effort. Grossly normal b/l UE ROM and strength; reduced ROM, strength, and guarding behaviors in L LE. Able to transition supine/sit with SUPV/CGA, but requires MinA for sit<>stand and transfers 2/2 unsteadiness, endorsing increased dizziness with movement and in sitting. After transfer to recliner, pt's dizziness and lightheadedness has not improved w/in 5 minutes, and pt request return to bed. Recommend ongoing OT while hospitalized, and HHOT post DC to assist pt to return to PLOF.      Follow Up Recommendations  Home health OT    Equipment Recommendations  Tub/shower seat    Recommendations for Other Services       Precautions / Restrictions Precautions Precautions: Fall Restrictions Weight Bearing Restrictions: No      Mobility Bed Mobility Overal bed mobility: Needs Assistance Bed Mobility: Supine to Sit;Sit to Supine     Supine to sit: Min guard          Transfers Overall transfer level: Needs assistance   Transfers: Sit to/from Stand Sit to Stand: Min  assist         General transfer comment: pt endorses dizziness with movement, demonstrates slight instability    Balance Overall balance assessment: Needs assistance Sitting-balance support: No upper extremity supported;Feet supported Sitting balance-Leahy Scale: Good     Standing balance support: No upper extremity supported Standing balance-Leahy Scale: Fair                             ADL either performed or assessed with clinical judgement   ADL Overall ADL's : Needs assistance/impaired                     Lower Body Dressing: Minimal assistance                       Vision Baseline Vision/History: Wears glasses Wears Glasses: At all times Patient Visual Report: No change from baseline Additional Comments: does not have glasses with her at present     Perception     Praxis      Pertinent Vitals/Pain Pain Assessment: Faces Pain Score: 5  Faces Pain Scale: Hurts little more Pain Location: L LE, back Pain Descriptors / Indicators: Grimacing;Aching;Guarding Pain Intervention(s): Limited activity within patient's tolerance;Monitored during session;Repositioned     Hand Dominance     Extremity/Trunk Assessment Upper Extremity Assessment Upper Extremity Assessment: Overall WFL for tasks assessed   Lower Extremity Assessment Lower Extremity Assessment: Generalized weakness       Communication Communication Communication: No difficulties   Cognition Arousal/Alertness: Awake/alert Behavior During Therapy: WFL for tasks assessed/performed Overall Cognitive Status: Within Functional Limits for tasks  assessed                                     General Comments       Exercises Other Exercises: Min A LB dressing, CGA for sit/stand, Min A for standing balance. Educ re: comp strategies for safely performing ADL.   Shoulder Instructions      Home Living Family/patient expects to be discharged to:: Private  residence Living Arrangements: Spouse/significant other Available Help at Discharge: Family;Available 24 hours/day Type of Home: House Home Access: Level entry     Home Layout: Two level;Able to live on main level with bedroom/bathroom Alternate Level Stairs-Number of Steps: bonus room upstairs; no essential needs in space.   Bathroom Shower/Tub: Occupational psychologist: Standard     Home Equipment: Cane - single point;Walker - 2 wheels;Grab bars - toilet;Grab bars - tub/shower          Prior Functioning/Environment Level of Independence: Independent with assistive device(s)        Comments: Indep without assist device for ADLs, household mobility; [place] for longer, community distances (due to chronic L hip pain).  Denies fall history.        OT Problem List: Decreased strength;Decreased range of motion;Decreased activity tolerance;Impaired balance (sitting and/or standing)      OT Treatment/Interventions: Self-care/ADL training;Therapeutic exercise;Patient/family education;Balance training;Energy conservation;Therapeutic activities    OT Goals(Current goals can be found in the care plan section) Acute Rehab OT Goals Patient Stated Goal: to return home OT Goal Formulation: With patient Time For Goal Achievement: 01/30/21 Potential to Achieve Goals: Good ADL Goals Pt Will Perform Lower Body Dressing: with modified independence;sit to/from stand (w/ AE as needed) Pt Will Perform Toileting - Clothing Manipulation and hygiene: with modified independence;sit to/from stand Pt/caregiver will Perform Home Exercise Program: Increased ROM;Increased strength;Independently  OT Frequency: Min 1X/week   Barriers to D/C:            Co-evaluation              AM-PAC OT "6 Clicks" Daily Activity     Outcome Measure Help from another person eating meals?: None Help from another person taking care of personal grooming?: None Help from another person toileting, which  includes using toliet, bedpan, or urinal?: A Little Help from another person bathing (including washing, rinsing, drying)?: A Little Help from another person to put on and taking off regular upper body clothing?: None Help from another person to put on and taking off regular lower body clothing?: A Little 6 Click Score: 21   End of Session    Activity Tolerance: Patient tolerated treatment well;Other (comment) (limited by lightheadedness) Patient left: in bed;with call bell/phone within reach;with bed alarm set  OT Visit Diagnosis: Unsteadiness on feet (R26.81);Muscle weakness (generalized) (M62.81);Pain Pain - Right/Left: Left Pain - part of body: Leg;Hip                Time: 1040-1108 OT Time Calculation (min): 28 min Charges:  OT General Charges $OT Visit: 1 Visit OT Evaluation $OT Eval Low Complexity: 1 Low OT Treatments $Self Care/Home Management : 23-37 mins  Josiah Lobo, PhD, MS, OTR/L 01/16/21, 11:27 AM

## 2021-01-16 NOTE — Progress Notes (Addendum)
[facility] PROGRESS NOTE    Progress Note  Janice Tyler  BTD:[id number] DOB: 1946-02-01 DOA: 01/15/2021 PCP: Kirk Ruths, MD     Brief Narrative:   Janice Tyler is an 75 y.o. 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  As per husband she was having a lumbar radioablation procedure on the day of admission she did well on the right side wound which she got moved to the left side she had a lot of pain was given sedation then she became slurry and drowsy did not loss of consciousness.  When the admitting physician saw the patient she was back to normal.  Her INR was 1.4 before the procedure.   Significant studies: 01/15/2021 CT of the head showed no acute findings. 01/15/2021 MRI of the brain showed a question acute white matter infarct which cannot be confirmed by diffusion imaging, she does have mild chronic small vessel ischemic changes.  Antibiotics: None  Microbiology data: Blood culture:  Procedures: None  Assessment/Plan:   Slurred speech: MRI with results as above, question incidental. From history it seems like her slurred speech was likely medication induced. Neurology was consulted, A1c is pending Fasting lipid panel LDL 62 HDL 76. Start on IV heparin to bridge with Coumadin for an INR goal of 2.5-3.5. Recommended to be seen Lamictal, continue home statins and monitor on telemetry. Therapy evaluation is pending.  COPD (chronic obstructive pulmonary disease) (HCC) No exacerbation appears stable continue inhalers.  Essential hypertension: Continue metoprolol and amlodipine pressure is improved. Can resume home medications.    Mechanical heart valve present With mechanical valve INR this morning is 1.2, started on IV heparin will continue to bridge to Coumadin  for goal INR of 2.5-3.5. Continue IV heparin her INR is trending in the wrong direction, yesterday was 1.4 this morning is 1.2. Continue Coumadin per pharmacy.  HLD (hyperlipidemia) Continue statins.  GERD (gastroesophageal reflux disease) Continue Protonix.  Seizure ([place]) Resume Lamictal.    DVT prophylaxis: heparin Family Communication:husband Status is: Inpatient  Patient, we need to get her INR to 2.5, yesterday was 1.4 this morning is 1.2 which is trending in the wrong direction she was started on IV heparin and will have to bridge with Coumadin until INR is heading in the right direction and or goal INR 2.5-3.5.  She will need at least another 48 hours of IV heparin before we can switch her to Lovenox once her INR is trending up for 2 consecutive days we could change her to Lovenox.  Dispo: The patient is from: Home              Anticipated d/c is to: Home              Patient currently is not medically stable to d/c.   Difficult to place patient No  Code Status:     Code Status Orders  (From admission, onward)         Start     Ordered   01/15/21 1508  Full code  Continuous        01/15/21 1507        Code Status History    This patient has a current code status but no historical code status.   Advance Care Planning Activity        IV Access:    Peripheral IV  Procedures and diagnostic studies:   CT Head Wo Contrast  Result Date: 01/15/2021 CLINICAL DATA:  75 year old female with slurred speech, altered mental status. EXAM: CT HEAD WITHOUT CONTRAST TECHNIQUE: Contiguous axial images were obtained from the base of the skull through the vertex without intravenous contrast. COMPARISON:  None. FINDINGS: Brain: No midline shift, ventriculomegaly, mass effect, evidence of mass lesion, intracranial hemorrhage or evidence of cortically based acute infarction. Gray-white matter differentiation is within normal limits for age in both hemispheres. Some  generalized cerebral volume loss but no encephalomalacia identified. Vascular: Calcified atherosclerosis at the skull base. No suspicious intracranial vascular hyperdensity. Skull: Negative. Sinuses/Orbits: Visualized paranasal sinuses and mastoids are clear. Other: Visualized orbits and scalp soft tissues are within normal limits. IMPRESSION: No acute intracranial abnormality. Negative for age non contrast CT appearance of the brain. Electronically Signed   By: Genevie Ann M.D.   On: 01/15/2021 11:45   MR BRAIN WO CONTRAST  Result Date: 01/15/2021 CLINICAL DATA:  Development of slurred speech during medical procedure. Possible seizure. EXAM: MRI HEAD WITHOUT CONTRAST TECHNIQUE: Multiplanar, multiecho pulse sequences of the brain and surrounding structures were obtained without intravenous contrast. COMPARISON:  Head CT same day FINDINGS: Brain: The brainstem is normal. 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 Cerebral hemispheres show mild chronic small-vessel ischemic change of the white matter. Diffusion imaging raises the possibility of a punctate acute white matter infarction in the corona radiography on the left, diffusion axial image 31. This cannot be confirmed on the coronal exam however and is therefore questionable. No cortical or large vessel territory infarction. No mass lesion, hemorrhage, hydrocephalus or extra-axial collection. No postictal phenomenon is visible. Mesial temporal lobes are normal. Vascular: Major vessels at the base of the brain show flow. Skull and upper cervical spine: Negative Sinuses/Orbits: Clear/normal Other: None IMPRESSION: 1. Question punctate acute white matter infarction in the corona radiography on the left, diffusion axial image 31. This cannot be confirmed on the coronal diffusion exam however and is therefore questionable. 2. Mild chronic small-vessel ischemic change of the cerebellum and cerebral hemispheric white matter. 3. No abnormality seen that  would be expected to cause seizure. No evidence of postictal phenomenon. Electronically Signed   By: Nelson Chimes M.D.   On: 01/15/2021 14:09     Medical Consultants:    None.   Subjective:    Janice Tyler relates she is back to normal, she does relate this happen right after she was given the sedation and medication for the procedure.  Objective:    Vitals:   01/16/21 0000 01/16/21 0208 01/16/21 0430 01/16/21 0628  BP: (!) 142/66 (!) 142/52 (!) 150/59 105/75  Pulse: (!) 54 (!) 50 (!) 51 (!) 53  Resp: 19 19 20 20   Temp: 97.8 F (36.6 C) 97.9 F (36.6 C) 97.9 F (36.6 C) 97.7 F (36.5 C)  TempSrc: Oral Oral Oral Oral  SpO2: 96% 95% 97% 96%  Weight:      Height:       SpO2: 96 %   Intake/Output Summary (Last 24 hours) at 01/16/2021 0806 Last data filed at 01/16/2021 0351 Gross per 24 hour  Intake 216.68 ml  Output --  Net 216.68 ml   Filed Weights   01/15/21 1017  Weight: 65.8 kg    Exam: General exam: In no acute distress. Respiratory system: Good air movement and clear to auscultation. Cardiovascular system: S1 & S2 heard, RRR. No JVD, murmurs, rubs, gallops or clicks.  Gastrointestinal system:  Abdomen is nondistended, soft and nontender.  Central nervous system: Alert and oriented. No focal neurological deficits. Extremities: No pedal edema. Skin: No rashes, lesions or ulcers Psychiatry: Judgement and insight appear normal. Mood & affect appropriate.    Data Reviewed:    Labs: Basic Metabolic Panel: Recent Labs  Lab 01/15/21 1023  NA 140  K 3.8  CL 105  CO2 28  GLUCOSE 123*  BUN 16  CREATININE 0.90  CALCIUM 9.8   GFR Estimated Creatinine Clearance: 47.9 mL/min (by C-G formula based on SCr of 0.9 mg/dL). Liver Function Tests: No results for input(s): AST, ALT, ALKPHOS, BILITOT, PROT, ALBUMIN in the last 168 hours. No results for input(s): LIPASE, AMYLASE in the last 168 hours. No results for input(s): AMMONIA in the last 168  hours. Coagulation profile Recent Labs  Lab 01/15/21 1023 01/16/21 0434  INR 1.4* 1.2   COVID-19 Labs  No results for input(s): DDIMER, FERRITIN, LDH, CRP in the last 72 hours.  Lab Results  Component Value Date   SARSCOV2NAA NEGATIVE 01/15/2021   [place] NEGATIVE 06/14/2019    CBC: Recent Labs  Lab 01/15/21 1023 01/16/21 0434  WBC 6.4 6.6  NEUTROABS 5.0  --   HGB 12.3 11.4*  HCT 39.9 36.2  MCV 83.5 82.3  PLT 187 168   Cardiac Enzymes: No results for input(s): CKTOTAL, CKMB, CKMBINDEX, TROPONINI in the last 168 hours. BNP (last 3 results) No results for input(s): PROBNP in the last 8760 hours. CBG: No results for input(s): GLUCAP in the last 168 hours. D-Dimer: No results for input(s): DDIMER in the last 72 hours. Hgb A1c: No results for input(s): HGBA1C in the last 72 hours. Lipid Profile: Recent Labs    01/16/21 0434  CHOL 148  HDL 76  LDLCALC 62  TRIG 51  CHOLHDL 1.9   Thyroid function studies: No results for input(s): TSH, T4TOTAL, T3FREE, THYROIDAB in the last 72 hours.  Invalid input(s): FREET3 Anemia work up: No results for input(s): VITAMINB12, FOLATE, FERRITIN, TIBC, IRON, RETICCTPCT in the last 72 hours. Sepsis Labs: Recent Labs  Lab 01/15/21 1023 01/16/21 0434  WBC 6.4 6.6   Microbiology Recent Results (from the past 240 hour(s))  SARS CORONAVIRUS 2 (TAT 6-24 HRS) Nasopharyngeal Nasopharyngeal Swab     Status: None   Collection Time: 01/15/21  2:56 PM   Specimen: Nasopharyngeal Swab  Result Value Ref Range Status   SARS Coronavirus 2 NEGATIVE NEGATIVE Final    Comment: (NOTE) SARS-CoV-2 target nucleic acids are NOT DETECTED.  The SARS-CoV-2 RNA is generally detectable in upper and lower respiratory specimens during the acute phase of infection. Negative results do not preclude SARS-CoV-2 infection, do not rule out co-infections with other pathogens, and should not be used as the sole basis for treatment or other patient  management decisions. Negative results must be combined with clinical observations, patient history, and epidemiological information. The expected result is Negative.  Fact Sheet for Patients: SugarRoll.be  Fact Sheet for Healthcare Providers: https://www.woods-mathews.com/  This test is not yet approved or cleared by the [place] FDA and  has been authorized for detection and/or diagnosis of SARS-CoV-2 by FDA under an Emergency Use Authorization (EUA). This EUA will remain  in effect (meaning this test can be used) for the duration of the COVID-19 declaration under Se ction 564(b)(1) of the Act, 21 U.S.C. section 360bbb-3(b)(1), unless the authorization is terminated or revoked sooner.  Performed at [facility], [place] [address], [place], [place] [address]  Medications:   . aspirin EC  81 mg Oral Daily  . atorvastatin  40 mg Oral QHS  . lamoTRIgine  150 mg Oral BID  . Warfarin - Pharmacist Dosing Inpatient   Does not apply q1600   Continuous Infusions: . heparin 800 Units/hr (01/16/21 0351)      LOS: 0 days   Charlynne Cousins  [facility]  01/16/2021, 8:06 AM

## 2021-01-16 NOTE — Progress Notes (Signed)
SLP Cancellation Note  Patient Details Name: Janice Tyler MRN: [id number] DOB: Jan 21, 1946   Cancelled treatment:       Reason Eval/Treat Not Completed: SLP screened, no needs identified, will sign off (chart reviewed; consulted NSG then met w/ pt).   Pt denied any difficulty swallowing and is currently on a regular diet; tolerates swallowing pills w/ water per NSG. She asked about a "snack" at night w/ her evening Meds, so explained the options of snacks in the Nourishment Room for pts after the Dinner hours. Pt conversed at conversational level w/ this SLP w/out deficits noted; pt denied any speech-language deficits "now" stating she was speaking at her Baseline.  No further skilled ST services indicated as pt appears at her baseline. Pt agreed. NSG to reconsult if any change in status while admitted.     Orinda Kenner, MS, CCC-SLP Speech Language Pathologist Rehab Services [phone] [facility] 01/16/2021, 11:47 AM

## 2021-01-16 NOTE — Consult Note (Signed)
ANTICOAGULATION CONSULT NOTE  Pharmacy Consult for warfarin with heparin infusion to bridge Indication: Embolic stroke / Afib / hx MAVR  Patient Measurements: Heparin Dosing Weight: 64.6 kg  Labs: Recent Labs    01/15/21 1023 01/15/21 2152 01/16/21 0434  HGB 12.3  --  11.4*  HCT 39.9  --  36.2  PLT 187  --  168  APTT 40*  --   --   LABPROT 16.8*  --  15.2  INR 1.4*  --  1.2  HEPARINUNFRC  --  0.32 0.58  CREATININE 0.90  --   --     Estimated Creatinine Clearance: 47.9 mL/min (by C-G formula based on SCr of 0.9 mg/dL).   Medical History: Past Medical History:  Diagnosis Date  . CHF (congestive heart failure) ([place])   . Chronic kidney disease    CKD  . COPD (chronic obstructive pulmonary disease) ([place])   . Dysrhythmia    Atrial Fibrillation  . GERD (gastroesophageal reflux disease)   . Hypertension   . 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 Brain MRI concerning for acute infarction. Patient with subtherapeutic INR of 1.4. Patient took vitamin K prior to procedure. Pharmacy has been consulted to initiate heparin infusion.  Baseline CBC within normal limits. Baseline INR 1.4. Add-on aPTT pending.   Date INR Plan  3/10 1.4 Warfarin 10 mg    Goal of Therapy:  Heparin level 0.3 - 0.5 units/ml INR 2.5 - 3.5 Monitor platelets by anticoagulation protocol: Yes   Plan:   Warfarin --INR is subtherapeutic. Will give warfarin 10 mg x 1 tonight --Daily INR per protocol  Heparin  03/10 2152 HL 0.32, therapeutic x 1 03/11 0434 HL 0.58,  therapeutic x 2   Plan:  --Continue heparin at 800 units/hr --Check HL daily with AM labs --Daily CBC per protocol while on IV heparin --Anticipate bridge for approximately 5 days and until INR therapeutic x 2 days  Renda Rolls, PharmD, [facility] 01/16/2021 6:58 AM

## 2021-01-16 NOTE — Progress Notes (Signed)
STROKE TEAM PROGRESS NOTE   SUBJECTIVE (INTERVAL HISTORY) No family is at the bedside.  Overall her condition is stable. No acute event overnight and she has no new complains except continued left LBP and left leg pain. She walked with PT today and recommend [place] [facility]. However, her INR today 1.2. still on heparin IV.    OBJECTIVE Temp:  [97.6 F (36.4 C)-98.1 F (36.7 C)] 97.7 F (36.5 C) (03/11 0628) Pulse Rate:  [48-70] 60 (03/11 0900) Cardiac Rhythm: Sinus bradycardia (03/11 0701) Resp:  [11-20] 20 (03/11 0628) BP: (105-193)/(52-110) 193/67 (03/11 0900) SpO2:  [88 %-100 %] 96 % (03/11 0628) Weight:  [65.8 kg] 65.8 kg (03/10 1017)  No results for input(s): GLUCAP in the last 168 hours. Recent Labs  Lab 01/15/21 1023  NA 140  K 3.8  CL 105  CO2 28  GLUCOSE 123*  BUN 16  CREATININE 0.90  CALCIUM 9.8   No results for input(s): AST, ALT, ALKPHOS, BILITOT, PROT, ALBUMIN in the last 168 hours. Recent Labs  Lab 01/15/21 1023 01/16/21 0434  WBC 6.4 6.6  NEUTROABS 5.0  --   HGB 12.3 11.4*  HCT 39.9 36.2  MCV 83.5 82.3  PLT 187 168   No results for input(s): CKTOTAL, CKMB, CKMBINDEX, TROPONINI in the last 168 hours. Recent Labs    01/15/21 1023 01/16/21 0434  LABPROT 16.8* 15.2  INR 1.4* 1.2   No results for input(s): COLORURINE, LABSPEC, PHURINE, GLUCOSEU, HGBUR, BILIRUBINUR, KETONESUR, PROTEINUR, UROBILINOGEN, NITRITE, LEUKOCYTESUR in the last 72 hours.  Invalid input(s): APPERANCEUR     Component Value Date/Time   CHOL 148 01/16/2021 0434   TRIG 51 01/16/2021 0434   HDL 76 01/16/2021 0434   CHOLHDL 1.9 01/16/2021 0434   VLDL 10 01/16/2021 0434   LDLCALC 62 01/16/2021 0434   No results found for: HGBA1C No results found for: LABOPIA, COCAINSCRNUR, LABBENZ, AMPHETMU, THCU, LABBARB  No results for input(s): ETH in the last 168 hours.  I have personally reviewed the radiological images below and agree with the radiology interpretations.  CT Head Wo  Contrast  Result Date: 01/15/2021 CLINICAL DATA:  75 year old female with slurred speech, altered mental status. EXAM: CT HEAD WITHOUT CONTRAST TECHNIQUE: Contiguous axial images were obtained from the base of the skull through the vertex without intravenous contrast. COMPARISON:  None. FINDINGS: Brain: No midline shift, ventriculomegaly, mass effect, evidence of mass lesion, intracranial hemorrhage or evidence of cortically based acute infarction. Gray-white matter differentiation is within normal limits for age in both hemispheres. Some generalized cerebral volume loss but no encephalomalacia identified. Vascular: Calcified atherosclerosis at the skull base. No suspicious intracranial vascular hyperdensity. Skull: Negative. Sinuses/Orbits: Visualized paranasal sinuses and mastoids are clear. Other: Visualized orbits and scalp soft tissues are within normal limits. IMPRESSION: No acute intracranial abnormality. Negative for age non contrast CT appearance of the brain. Electronically Signed   By: Genevie Ann M.D.   On: 01/15/2021 11:45   MR BRAIN WO CONTRAST  Result Date: 01/15/2021 CLINICAL DATA:  Development of slurred speech during medical procedure. Possible seizure. EXAM: MRI HEAD WITHOUT CONTRAST TECHNIQUE: Multiplanar, multiecho pulse sequences of the brain and surrounding structures were obtained without intravenous contrast. COMPARISON:  Head CT same day FINDINGS: Brain: The brainstem is normal. 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 Cerebral hemispheres show mild chronic small-vessel ischemic change of the white matter. Diffusion imaging raises the possibility of a punctate acute white matter infarction in the corona radiography on the left, diffusion  axial image 31. This cannot be confirmed on the coronal exam however and is therefore questionable. No cortical or large vessel territory infarction. No mass lesion, hemorrhage, hydrocephalus or extra-axial collection. No postictal  phenomenon is visible. Mesial temporal lobes are normal. Vascular: Major vessels at the base of the brain show flow. Skull and upper cervical spine: Negative Sinuses/Orbits: Clear/normal Other: None IMPRESSION: 1. Question punctate acute white matter infarction in the corona radiography on the left, diffusion axial image 31. This cannot be confirmed on the coronal diffusion exam however and is therefore questionable. 2. Mild chronic small-vessel ischemic change of the cerebellum and cerebral hemispheric white matter. 3. No abnormality seen that would be expected to cause seizure. No evidence of postictal phenomenon. Electronically Signed   By: Nelson Chimes M.D.   On: 01/15/2021 14:09    PHYSICAL EXAM  Temp:  [97.6 F (36.4 C)-98.1 F (36.7 C)] 97.7 F (36.5 C) (03/11 0628) Pulse Rate:  [48-70] 60 (03/11 0900) Resp:  [11-20] 20 (03/11 0628) BP: (105-193)/(52-110) 193/67 (03/11 0900) SpO2:  [88 %-100 %] 96 % (03/11 0628) Weight:  [65.8 kg] 65.8 kg (03/10 1017)  General - well nourished, well developed, in no apparent distress.    Ophthalmologic - fundi not visualized due to noncooperation.    Cardiovascular - regular rhythm and rate  Mental Status -  Level of arousal and orientation to time, place, and person were intact. Language including expression, naming, repetition, comprehension, reading, and writing was assessed and found intact. Fund of Knowledge was assessed and was intact.  Cranial Nerves II - XII - II - Vision intact OU. III, IV, VI - Extraocular movements intact. V - Facial sensation intact bilaterally. VII - Facial movement intact bilaterally. VIII - Hearing & vestibular intact bilaterally. X - Palate elevates symmetrically. XI - Chin turning & shoulder shrug intact bilaterally. XII - Tongue protrusion intact.  Motor Strength - The patient's strength was normal in all extremities and pronator drift was absent except left LE psoas and knee flexion 3/5 due to pain  on lifting up, but distal PF/DF 5/5.   Motor Tone & Bulk - Muscle tone was assessed at the neck and appendages and was normal.  Bulk was normal and fasciculations were absent.   Reflexes - The patient's reflexes were normal in all extremities and she had no pathological reflexes.  Sensory - Light touch, temperature/pinprick were assessed and were normal.    Coordination - The patient had normal movements in the hands with no ataxia or dysmetria.  Tremor was absent.  Gait and Station - deferred   ASSESSMENT/PLAN Ms. Janice Tyler is a 75 y.o. female with history of mechanical AVR in 2002 on coumadin with INR goal 2.5-3.5, PAF, HLD on lipitor, HTN, CHF, DM, seizure disorder on lamictal presented to ER for slurry speech and decreased consciousness during procedure. Time onset 9am. Neuro exam more consistent with left back pain and leg pain on lifting up. Her symptoms more likely related to her sedation given for her pain during the procedure. However, her INR was low, and MRI questionable punctate left CR DWI restriction, there is concern for potential embolic phenomenon. She is on heparin IV to bridge coumadin with INR goal 2.5-3.5. LDL 62 and A1C 6.4. No further stroke work up needed at this time, as it will not change management. May consider lovenox bridging if she is going to be discharged before INR at goal.   Plan: - PT / OT - on heparin IV bridge  to coumadin with INR goal 2.5-3.5. May consider lovenox bridging if she is going to be discharged before INR at goal.  - continue home lamictal for seizure prevention - continue home statin - Risk factor modification - Telemetry monitoring - Neuro checks - continue to follow up with cardiology and Dr. Manuella Ghazi neurology (02/03/21) on discharge.  Hospital day # 0  Neurology will sign off. Please call with questions. Pt will follow up with Dr. Manuella Ghazi at [facility] on 02/03/21. Thanks for the consult.   Rosalin Hawking, MD PhD Stroke  Neurology 01/16/2021 10:10 AM    To contact Stroke Continuity provider, please refer to [email or web address] After hours, contact General Neurology

## 2021-01-16 NOTE — Evaluation (Signed)
Physical Therapy Evaluation Patient Details Name: Janice Tyler MRN: [id number] DOB: 11/01/1946 Today's Date: 01/16/2021   History of Present Illness  presented to ER after episode of slurred speech, L LE pain during lumbar radioablation procedure; admitted for TIA/CVA work up. 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  Reports resolution of slurred speech, but endorses persistent L hip pain (radiating to L knee, anterior portion).  L hip guarded with limited strength (3-/5) and ROM (approx 50-75% normal range) as result; otherwise, bilat LE strength and ROM grossly symmetrical and WFL.  Able to complete bed mobility with mod indep; sit/stand, basic transfers and gait (120') without assist device, cga/min assist.  Demonstrates partially reciprocal deteriorating to step to gait pattern throughout distance; decreased stance time/loading phase to L LE (due to hip pain); guarded trunk rotation and arm swing.  Patient does feel this is similar to baseline for her; recommend continued use of [facility] for optimal safety/stability wtih gait efforts at this time. Would benefit from skilled PT to address above deficits and promote optimal return to PLOF.; Recommend transition to HHPT upon discharge from acute hospitalization.     Follow Up Recommendations Home health PT    Equipment Recommendations       Recommendations for Other Services       Precautions / Restrictions Precautions Precautions: Fall Restrictions Weight Bearing Restrictions: No      Mobility  Bed Mobility Overal bed mobility: Modified Independent                  Transfers Overall transfer level: Needs assistance   Transfers: Sit to/from Stand Sit to Stand: Min guard;Supervision         General transfer comment: self-initiates UE support for optimal  safety  Ambulation/Gait Ambulation/Gait assistance: Min guard Gait Distance (Feet): 120 Feet Assistive device: None       General Gait Details: partially reciprocal deteriorating to step to gait pattern throughout distance; decreased stance time/loading phase to L LE (due to hip pain); guarded trunk rotation and arm swing.  Patient does feel this is similar to baseline for her; recommend continued use of [facility] for optimal safety/stability wtih gait efforts at this time.  Stairs            Wheelchair Mobility    Modified Rankin (Stroke Patients Only)       Balance Overall balance assessment: Needs assistance Sitting-balance support: No upper extremity supported;Feet supported Sitting balance-Leahy Scale: Good     Standing balance support: No upper extremity supported Standing balance-Leahy Scale: Fair                               Pertinent Vitals/Pain Pain Assessment: Faces Faces Pain Scale: Hurts little more Pain Location: L LE Pain Descriptors / Indicators: Grimacing;Aching;Guarding Pain Intervention(s): Limited activity within patient's tolerance;Monitored during session;Repositioned    Home Living Family/patient expects to be discharged to:: Private residence Living Arrangements: Spouse/significant other Available Help at Discharge: Family;Available 24 hours/day Type of Home: House Home Access: Level entry     Home Layout: Two level;Bed/bath upstairs Home Equipment: Cane - single point      Prior Function Level of Independence: Independent with assistive device(s)         Comments: Indep without assist device for ADLs, household mobility; [place] for longer, community distances (due to  chronic L hip pain).  Denies fall history.     Hand Dominance        Extremity/Trunk Assessment   Upper Extremity Assessment Upper Extremity Assessment: Overall WFL for tasks assessed    Lower Extremity Assessment Lower Extremity Assessment:  (L hip  grossly 3-/5, limited by pain; otherwise, bilat LE strength and ROM grossly symmetrical and WFL.  Does endorse radicular/radiating pain L hip to knee (anterior surface), but denies other sensory deficit)       Communication   Communication: No difficulties  Cognition Arousal/Alertness: Awake/alert Behavior During Therapy: WFL for tasks assessed/performed Overall Cognitive Status: Within Functional Limits for tasks assessed                                        General Comments      Exercises Other Exercises Other Exercises: Lower body dressing, peri-care/hygiene during session, cga/close sup for sit/stand and standing balacne. Limited dynamic balance and functional reach evident; does self-initiate use of compensatory strategies (UE support, sitting to thread LEs) as needed.  Educated in hemi-dressing techniques to accommodate L hip pain; min assist to reach feet and thread undergarments over LEs.   Assessment/Plan    PT Assessment Patient needs continued PT services  PT Problem List Decreased strength;Decreased activity tolerance;Decreased balance;Decreased range of motion;Decreased mobility;Pain       PT Treatment Interventions DME instruction;Gait training;Functional mobility training;Therapeutic activities;Therapeutic exercise;Balance training;Patient/family education    PT Goals (Current goals can be found in the Care Plan section)  Acute Rehab PT Goals Patient Stated Goal: to return home PT Goal Formulation: With patient Time For Goal Achievement: 01/30/21 Potential to Achieve Goals: Good    Frequency Min 2X/week   Barriers to discharge        Co-evaluation               AM-PAC PT "6 Clicks" Mobility  Outcome Measure Help needed turning from your back to your side while in a flat bed without using bedrails?: None Help needed moving from lying on your back to sitting on the side of a flat bed without using bedrails?: None Help needed moving  to and from a bed to a chair (including a wheelchair)?: A Little Help needed standing up from a chair using your arms (e.g., wheelchair or bedside chair)?: A Little Help needed to walk in hospital room?: A Little Help needed climbing 3-5 steps with a railing? : A Little 6 Click Score: 20    End of Session Equipment Utilized During Treatment: Gait belt Activity Tolerance: Patient tolerated treatment well Patient left: in bed;with call bell/phone within reach;with bed alarm set Nurse Communication: Mobility status PT Visit Diagnosis: Difficulty in walking, not elsewhere classified (R26.2);Muscle weakness (generalized) (M62.81)    Time: 1572-6203 PT Time Calculation (min) (ACUTE ONLY): 23 min   Charges:   PT Evaluation $PT Eval Moderate Complexity: 1 Mod PT Treatments $Therapeutic Activity: 8-22 mins        Zonie Crutcher H. Owens Shark, PT, DPT, NCS 01/16/21, 9:41 AM [phone]

## 2021-01-16 NOTE — TOC Initial Note (Signed)
Transition of Care [facility]) - Initial/Assessment Note    Patient Details  Name: Janice Tyler MRN: [id number] Date of Birth: September 24, 1946  [facility]) CM/SW Contact:    Shelbie Hutching, RN Phone Number: 01/16/2021, 4:18 PM  Clinical Narrative:                 Patient admitted to the hospital for acute ischemic stroke.  RNCM was able to meet with patient and her husband at the bedside.  Patient is from home and independent at baseline, she has a cane and walker but only uses the cane when she leaves the house.  PT has recommended home health and patient is agreeable, she chooses [place].  Floydene Flock with [facility] has accepted the referral for PT and OT.  Patient drives and is current with her PCP.  TOC will cont to follow.   Expected Discharge Plan: [place] Barriers to Discharge: Continued Medical Work up   Patient Goals and CMS Choice Patient states their goals for this hospitalization and ongoing recovery are:: For her head to clear up and her INR to get to normal CMS Medicare.gov Compare Post Acute Care list provided to:: Patient Choice offered to / list presented to : Patient  Expected Discharge Plan and Services Expected Discharge Plan: [place]   Discharge Planning Services: CM Consult Post Acute Care Choice: [place] arrangements for the past 2 months: Single Family Home                 DME Arranged: N/A DME Agency: NA       HH Arranged: PT,OT [place] Agency: [place] ([place]) Date [place]: 01/16/21 Time [place]: 1600 Representative spoke with at [place]: Floydene Flock  Prior Living Arrangements/Services Living arrangements for the past 2 months: [place] with:: Spouse Patient language and need for interpreter reviewed:: Yes Do you feel safe going back to the place where you live?: Yes      Need for Family Participation in Patient Care: Yes  (Comment) Care giver support system in place?: Yes (comment) Current home services: DME (cane and walker) Criminal Activity/Legal Involvement Pertinent to Current Situation/Hospitalization: No - Comment as needed  Activities of Daily Living Home Assistive Devices/Equipment: Cane (specify quad or straight) ADL Screening (condition at time of admission) Patient's cognitive ability adequate to safely complete daily activities?: Yes Is the patient deaf or have difficulty hearing?: No Does the patient have difficulty seeing, even when wearing glasses/contacts?: No Does the patient have difficulty concentrating, remembering, or making decisions?: No Patient able to express need for assistance with ADLs?: Yes Does the patient have difficulty dressing or bathing?: No Independently performs ADLs?: Yes (appropriate for developmental age) Does the patient have difficulty walking or climbing stairs?: No Weakness of Legs: Left Weakness of Arms/Hands: None  Permission Sought/Granted Permission sought to share information with : Case Manager,Family Supports,Other (comment) Permission granted to share information with : Yes, Verbal Permission Granted  Share Information with NAME: Dalene Seltzer  Permission granted to share info w AGENCY: [facility]  Permission granted to share info w Relationship: husband     Emotional Assessment Appearance:: Appears stated age Attitude/Demeanor/Rapport: Engaged Affect (typically observed): Accepting Orientation: : Oriented to Self,Oriented to Place,Oriented to  Time,Oriented to Situation Alcohol / Substance Use: Not Applicable Psych Involvement: No (comment)  Admission diagnosis:  Slurred speech [R47.81] Stroke [facility]) [I63.9] Acute ischemic stroke [facility]) [I63.9] Patient Active  Problem List   Diagnosis Date Noted  . Stroke ([place]) 01/15/2021  . Slurred speech 01/15/2021  . HLD (hyperlipidemia) 01/15/2021  . CAD (coronary artery disease) 01/15/2021  . GERD  (gastroesophageal reflux disease) 01/15/2021  . Seizure ([place]) 01/15/2021  . Chronic diastolic CHF (congestive heart failure) ([place]) 01/15/2021  . Absence seizures, intractable ([place]) 08/26/2020  . Acid reflux disease 08/26/2020  . COPD (chronic obstructive pulmonary disease) ([place]) 08/26/2020  . 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 Joint replaced 08/26/2020  . 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 Osteoporosis 08/26/2020  . Carotid bruit 08/26/2020  . Use of cane as ambulatory aid 08/23/2018  . Controlled type 2 diabetes mellitus with complication, without long-term current use of insulin ([place]) 02/21/2018  . Nonintractable epilepsy without status epilepticus ([place]) 06/06/2017  . Peripheral neuropathy, idiopathic 06/06/2017  . Aortic atherosclerosis ([place]) 04/12/2017  . Coronary artery disease involving native coronary artery of native heart without angina pectoris 04/12/2017  . Healthcare maintenance 01/06/2017  . Chronic bronchitis ([place]) 09/28/2016  . Essential (primary) hypertension 09/28/2016  . Hypercholesterolemia 09/28/2016  . Incidental lung nodule 09/28/2016  . 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 Urinary incontinence in female 11/08/2008   PCP:  Kirk Ruths, MD Pharmacy:   [id number] - [place], [address] [place] [address] Phone: [phone] Fax: [phone]     Social Determinants of Health (SDOH) Interventions    Readmission Risk Interventions No flowsheet data found.

## 2021-01-16 NOTE — ED Notes (Signed)
Missing a pair of black boots. If found please call the patient's husband.

## 2021-01-16 NOTE — Consult Note (Signed)
ANTICOAGULATION CONSULT NOTE  Pharmacy Consult for warfarin with heparin infusion to bridge Indication: Embolic stroke / Afib / hx MAVR  Patient Measurements: Heparin Dosing Weight: 64.6 kg  Labs: Recent Labs    01/15/21 1023 01/15/21 2152 01/16/21 0434  HGB 12.3  --  11.4*  HCT 39.9  --  36.2  PLT 187  --  168  APTT 40*  --   --   LABPROT 16.8*  --  15.2  INR 1.4*  --  1.2  HEPARINUNFRC  --  0.32 0.58  CREATININE 0.90  --   --     Estimated Creatinine Clearance: 47.9 mL/min (by C-G formula based on SCr of 0.9 mg/dL).   Medical History: Past Medical History:  Diagnosis Date  . CHF (congestive heart failure) ([place])   . Chronic kidney disease    CKD  . COPD (chronic obstructive pulmonary disease) ([place])   . Dysrhythmia    Atrial Fibrillation  . GERD (gastroesophageal reflux disease)   . Hypertension   . 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 Brain MRI concerning for acute infarction. Patient with subtherapeutic INR of 1.4. Patient took vitamin K prior to procedure. Pharmacy has been consulted to initiate heparin infusion.  Baseline CBC within normal limits. Baseline INR 1.4. Add-on aPTT pending.   Date INR Plan  3/10 1.4 Warfarin 10 mg  3/11 1.2             Goal of Therapy:  Heparin level 0.3 - 0.5 units/ml INR 2.5 - 3.5 Monitor platelets by anticoagulation protocol: Yes   Plan:   Warfarin --INR is subtherapeutic. Will give warfarin 10 mg x 1 again tonight. * patient noted to have taken Vit K prior to procedure 3/10- Resumption  of therapeutic INR is usually delayed due to Vit K administration --Daily INR per protocol  Heparin  03/10 2152 HL 0.32, therapeutic x 1 03/11 0434 HL 0.58, therapeutic x 2   Plan:   --Continue heparin at 800 units/hr --Check HL daily with AM labs --Daily CBC per protocol while on IV heparin --Anticipate bridge for approximately 5 days and until INR therapeutic x 2 days  Chinita [place] PharmD Clinical Pharmacist 01/16/2021

## 2021-01-17 DIAGNOSIS — I480 Paroxysmal atrial fibrillation: Secondary | ICD-10-CM

## 2021-01-17 DIAGNOSIS — E7800 Pure hypercholesterolemia, unspecified: Secondary | ICD-10-CM

## 2021-01-17 DIAGNOSIS — G9341 Metabolic encephalopathy: Secondary | ICD-10-CM | POA: Diagnosis present

## 2021-01-17 LAB — CBC
HCT: 35.7 % — ABNORMAL LOW (ref 36.0–46.0)
Hemoglobin: 11.1 g/dL — ABNORMAL LOW (ref 12.0–15.0)
MCH: 26 pg (ref 26.0–34.0)
MCHC: 31.1 g/dL (ref 30.0–36.0)
MCV: 83.6 fL (ref 80.0–100.0)
Platelets: 162 10*3/uL (ref 150–400)
RBC: 4.27 MIL/uL (ref 3.87–5.11)
RDW: 13.8 % (ref 11.5–15.5)
WBC: 7.3 10*3/uL (ref 4.0–10.5)
nRBC: 0 % (ref 0.0–0.2)

## 2021-01-17 LAB — PROTIME-INR
INR: 1.3 — ABNORMAL HIGH (ref 0.8–1.2)
Prothrombin Time: 15.4 seconds — ABNORMAL HIGH (ref 11.4–15.2)

## 2021-01-17 LAB — HEPARIN LEVEL (UNFRACTIONATED): Heparin Unfractionated: 0.76 IU/mL — ABNORMAL HIGH (ref 0.30–0.70)

## 2021-01-17 MED ORDER — WARFARIN SODIUM 6 MG PO TABS
7.0000 mg | ORAL_TABLET | Freq: Once | ORAL | Status: AC
  Administered 2021-01-17: 7 mg via ORAL
  Filled 2021-01-17: qty 1

## 2021-01-17 MED ORDER — ENOXAPARIN SODIUM 80 MG/0.8ML ~~LOC~~ SOLN
1.0000 mg/kg | Freq: Two times a day (BID) | SUBCUTANEOUS | Status: DC
  Administered 2021-01-17 – 2021-01-18 (×2): 65 mg via SUBCUTANEOUS
  Filled 2021-01-17 (×4): qty 0.8

## 2021-01-17 MED ORDER — HEPARIN (PORCINE) 25000 UT/250ML-% IV SOLN
700.0000 [IU]/h | INTRAVENOUS | Status: DC
  Administered 2021-01-17: 700 [IU]/h via INTRAVENOUS

## 2021-01-17 NOTE — Progress Notes (Signed)
[facility] PROGRESS NOTE    Progress Note  Janice Tyler  ENM:[id number] DOB: [birth year] DOA: 01/15/2021 PCP: Kirk Ruths, MD     Brief Narrative:   Janice Tyler is an 75 y.o. 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  As per husband she was having a lumbar radioablation procedure on the day of admission she did well on the right side wound which she got moved to the left side she had a lot of pain was given sedation then she became slurry and drowsy did not loss of consciousness.  When the admitting physician saw the patient she was back to normal.  Her INR was 1.4 before the procedure.   Significant studies: 01/15/2021 CT of the head showed no acute findings. 01/15/2021 MRI of the brain showed a question acute white matter infarct which cannot be confirmed by diffusion imaging, she does have mild chronic small vessel ischemic changes.  Antibiotics: None  Microbiology data: Blood culture:  Procedures: None  Assessment/Plan:   Slurred speech: From history it seems like her slurred speech was likely medication induced. Recommended to be seen Lamictal, continue home statins and monitor on telemetry. Physical therapy evaluation recommended home health PT.  COPD (chronic obstructive pulmonary disease) (HCC) No exacerbation appears stable continue inhalers.  Essential hypertension: Continue metoprolol and amlodipine pressure is improved. Can resume home medications.    Mechanical heart valve present With mechanical valve INR this morning is 1.3, started on IV heparin will continue to bridge to Coumadin for goal INR of 2.5-3.5. Her INR is trending up today is 1.5 we will transition IV heparin to Lovenox.  HLD (hyperlipidemia) Continue statins.  GERD (gastroesophageal  reflux disease) Continue Protonix.  Seizure ([place]) Resume Lamictal.    DVT prophylaxis: heparin Family Communication:husband Status is: Inpatient  Patient, we need to get her INR to 2.5, yesterday was 1.4 this morning is 1.2 which is trending in the wrong direction she was started on IV heparin and will have to bridge with Coumadin until INR is heading in the right direction and or goal INR 2.5-3.5.  She will need at least another 48 hours of IV heparin before we can switch her to Lovenox once her INR is trending up for 2 consecutive days we could change her to Lovenox.  Dispo: The patient is from: Home              Anticipated d/c is to: Home              Patient currently is not medically stable to d/c.   Difficult to place patient No  Code Status:     Code Status Orders  (From admission, onward)         Start     Ordered   01/15/21 1508  Full code  Continuous        01/15/21 1507        Code Status History    This patient has a current code status but no historical code status.   Advance Care Planning Activity        IV Access:    Peripheral IV   Procedures and diagnostic studies:   CT Head Wo Contrast  Result Date: 01/15/2021 CLINICAL DATA:  75 year old female with slurred speech, altered mental status. EXAM: CT HEAD WITHOUT CONTRAST TECHNIQUE: Contiguous axial images were obtained from  the base of the skull through the vertex without intravenous contrast. COMPARISON:  None. FINDINGS: Brain: No midline shift, ventriculomegaly, mass effect, evidence of mass lesion, intracranial hemorrhage or evidence of cortically based acute infarction. Gray-white matter differentiation is within normal limits for age in both hemispheres. Some generalized cerebral volume loss but no encephalomalacia identified. Vascular: Calcified atherosclerosis at the skull base. No suspicious intracranial vascular hyperdensity. Skull: Negative. Sinuses/Orbits: Visualized paranasal sinuses and  mastoids are clear. Other: Visualized orbits and scalp soft tissues are within normal limits. IMPRESSION: No acute intracranial abnormality. Negative for age non contrast CT appearance of the brain. Electronically Signed   By: Genevie Ann M.D.   On: 01/15/2021 11:45   MR BRAIN WO CONTRAST  Result Date: 01/15/2021 CLINICAL DATA:  Development of slurred speech during medical procedure. Possible seizure. EXAM: MRI HEAD WITHOUT CONTRAST TECHNIQUE: Multiplanar, multiecho pulse sequences of the brain and surrounding structures were obtained without intravenous contrast. COMPARISON:  Head CT same day FINDINGS: Brain: The brainstem is normal. 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 Cerebral hemispheres show mild chronic small-vessel ischemic change of the white matter. Diffusion imaging raises the possibility of a punctate acute white matter infarction in the corona radiography on the left, diffusion axial image 31. This cannot be confirmed on the coronal exam however and is therefore questionable. No cortical or large vessel territory infarction. No mass lesion, hemorrhage, hydrocephalus or extra-axial collection. No postictal phenomenon is visible. Mesial temporal lobes are normal. Vascular: Major vessels at the base of the brain show flow. Skull and upper cervical spine: Negative Sinuses/Orbits: Clear/normal Other: None IMPRESSION: 1. Question punctate acute white matter infarction in the corona radiography on the left, diffusion axial image 31. This cannot be confirmed on the coronal diffusion exam however and is therefore questionable. 2. Mild chronic small-vessel ischemic change of the cerebellum and cerebral hemispheric white matter. 3. No abnormality seen that would be expected to cause seizure. No evidence of postictal phenomenon. Electronically Signed   By: Nelson Chimes M.D.   On: 01/15/2021 14:09     Medical Consultants:    None.   Subjective:    Janice Tyler feels great no new  complaints.  Objective:    Vitals:   01/16/21 1734 01/16/21 2106 01/16/21 2353 01/17/21 0625  BP:  140/61 (!) 134/55 (!) 131/52  Pulse: (!) 52 (!) 52 (!) 54 (!) 50  Resp: 16 16 16 15   Temp: 98.8 F (37.1 C) 98.2 F (36.8 C) 97.8 F (36.6 C) 97.7 F (36.5 C)  TempSrc: Oral Oral Oral Oral  SpO2: 97% 98% 96% 98%  Weight:      Height:       SpO2: 98 %   Intake/Output Summary (Last 24 hours) at 01/17/2021 1046 Last data filed at 01/17/2021 0900 Gross per 24 hour  Intake 1058.46 ml  Output --  Net 1058.46 ml   Filed Weights   01/15/21 1017  Weight: 65.8 kg    Exam: General exam: In no acute distress. Respiratory system: Good air movement and clear to auscultation. Cardiovascular system: S1 & S2 heard, RRR. No JVD. Gastrointestinal system: Abdomen is nondistended, soft and nontender.  Extremities: No pedal edema. Skin: No rashes, lesions or ulcers Psychiatry: Judgement and insight appear normal. Mood & affect appropriate.   Data Reviewed:    Labs: Basic Metabolic Panel: Recent Labs  Lab 01/15/21 1023  NA 140  K 3.8  CL 105  CO2 28  GLUCOSE 123*  BUN 16  CREATININE 0.90  CALCIUM 9.8   GFR Estimated Creatinine Clearance: 47.9 mL/min (by C-G formula based on SCr of 0.9 mg/dL). Liver Function Tests: No results for input(s): AST, ALT, ALKPHOS, BILITOT, PROT, ALBUMIN in the last 168 hours. No results for input(s): LIPASE, AMYLASE in the last 168 hours. No results for input(s): AMMONIA in the last 168 hours. Coagulation profile Recent Labs  Lab 01/15/21 1023 01/16/21 0434 01/17/21 0416  INR 1.4* 1.2 1.3*   COVID-19 Labs  No results for input(s): DDIMER, FERRITIN, LDH, CRP in the last 72 hours.  Lab Results  Component Value Date   SARSCOV2NAA NEGATIVE 01/15/2021   [place] NEGATIVE 06/14/2019    CBC: Recent Labs  Lab 01/15/21 1023 01/16/21 0434 01/17/21 0416  WBC 6.4 6.6 7.3  NEUTROABS 5.0  --   --   HGB 12.3 11.4* 11.1*  HCT 39.9 36.2  35.7*  MCV 83.5 82.3 83.6  PLT 187 168 162   Cardiac Enzymes: No results for input(s): CKTOTAL, CKMB, CKMBINDEX, TROPONINI in the last 168 hours. BNP (last 3 results) No results for input(s): PROBNP in the last 8760 hours. CBG: No results for input(s): GLUCAP in the last 168 hours. D-Dimer: No results for input(s): DDIMER in the last 72 hours. Hgb A1c: Recent Labs    01/16/21 0434  HGBA1C 6.4*   Lipid Profile: Recent Labs    01/16/21 0434  CHOL 148  HDL 76  LDLCALC 62  TRIG 51  CHOLHDL 1.9   Thyroid function studies: No results for input(s): TSH, T4TOTAL, T3FREE, THYROIDAB in the last 72 hours.  Invalid input(s): FREET3 Anemia work up: No results for input(s): VITAMINB12, FOLATE, FERRITIN, TIBC, IRON, RETICCTPCT in the last 72 hours. Sepsis Labs: Recent Labs  Lab 01/15/21 1023 01/16/21 0434 01/17/21 0416  WBC 6.4 6.6 7.3   Microbiology Recent Results (from the past 240 hour(s))  SARS CORONAVIRUS 2 (TAT 6-24 HRS) Nasopharyngeal Nasopharyngeal Swab     Status: None   Collection Time: 01/15/21  2:56 PM   Specimen: Nasopharyngeal Swab  Result Value Ref Range Status   SARS Coronavirus 2 NEGATIVE NEGATIVE Final    Comment: (NOTE) SARS-CoV-2 target nucleic acids are NOT DETECTED.  The SARS-CoV-2 RNA is generally detectable in upper and lower respiratory specimens during the acute phase of infection. Negative results do not preclude SARS-CoV-2 infection, do not rule out co-infections with other pathogens, and should not be used as the sole basis for treatment or other patient management decisions. Negative results must be combined with clinical observations, patient history, and epidemiological information. The expected result is Negative.  Fact Sheet for Patients: SugarRoll.be  Fact Sheet for Healthcare Providers: https://www.woods-mathews.com/  This test is not yet approved or cleared by the [place] FDA and   has been authorized for detection and/or diagnosis of SARS-CoV-2 by FDA under an Emergency Use Authorization (EUA). This EUA will remain  in effect (meaning this test can be used) for the duration of the COVID-19 declaration under Se ction 564(b)(1) of the Act, 21 U.S.C. section 360bbb-3(b)(1), unless the authorization is terminated or revoked sooner.  Performed at [facility], [place] [address] [place], [place] [address]      Medications:   . amLODipine  5 mg Oral Daily  . aspirin EC  81 mg Oral Daily  . atorvastatin  40 mg Oral QHS  . cholecalciferol  2,000 Units Oral Daily  . fluticasone furoate-vilanterol  1 puff Inhalation Daily   And  . umeclidinium bromide  1 puff Inhalation Daily  . furosemide  40 mg Oral Daily  . lamoTRIgine  150 mg Oral BID  . metoprolol tartrate  50 mg Oral BID  . pantoprazole  40 mg Oral Daily  . warfarin  7 mg Oral Once  . Warfarin - Pharmacist Dosing Inpatient   Does not apply q1600   Continuous Infusions: . heparin 700 Units/hr (01/17/21 0831)      LOS: 1 day   Charlynne Cousins  [facility]  01/17/2021, 10:46 AM

## 2021-01-17 NOTE — Consult Note (Signed)
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   Medical History: Past Medical History:  Diagnosis Date  . CHF (congestive heart failure) ([place])   . Chronic kidney disease    CKD  . COPD (chronic obstructive pulmonary disease) ([place])   . Dysrhythmia    Atrial Fibrillation  . GERD (gastroesophageal reflux disease)   . Hypertension   . 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 Brain MRI concerning for acute infarction. Patient with subtherapeutic INR of 1.4. Patient took vitamin K prior to procedure. Pharmacy has been consulted to transition from heparin infusion to enoxaparin.  Baseline CBC within normal limits. Baseline INR 1.4. Add-on aPTT pending.   Date INR Warfarin Dose  3/10 1.4 10 mg  3/11 1.2 10 mg  3/12 1.3         Goal of Therapy:  INR 2.5 - 3.5 Monitor platelets by anticoagulation protocol: Yes   Plan:   Warfarin --INR subtherapeutic, expect INR to start to increase in next 24-48 hrs given 10 mg x 2.  Warfarin 7 mg tonight * patient noted to have taken Vit K prior to procedure 3/10- Resumption of therapeutic INR is usually delayed due to Vit K administration --Daily INR per protocol  Enoxaparin  --Will discontinue heparin and give enoxaparin 65mg (~1mg /kg) Q12H --Daily CBC --Anticipate bridge for approximately 5 days and until INR therapeutic x 2 days  Pearla Dubonnet, PharmD Clinical Pharmacist 01/17/2021 12:37 PM

## 2021-01-17 NOTE — Progress Notes (Signed)
Notified Dr. Olevia Bowens of HR 48-50, BP 131/52 and metoprolol due.  Metoprolol held last night due to bradycardia.  Per MD to hold metoprolol.

## 2021-01-17 NOTE — Consult Note (Signed)
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   Medical History: Past Medical History:  Diagnosis Date  . CHF (congestive heart failure) ([place])   . Chronic kidney disease    CKD  . COPD (chronic obstructive pulmonary disease) ([place])   . Dysrhythmia    Atrial Fibrillation  . GERD (gastroesophageal reflux disease)   . Hypertension   . 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 Brain MRI concerning for acute infarction. Patient with subtherapeutic INR of 1.4. Patient took vitamin K prior to procedure. Pharmacy has been consulted to initiate heparin infusion.  Baseline CBC within normal limits. Baseline INR 1.4. Add-on aPTT pending.   Date INR Plan  3/10 1.4 Warfarin 10 mg  3/11 1.2             Goal of Therapy:  Heparin level 0.3 - 0.5 units/ml INR 2.5 - 3.5 Monitor platelets by anticoagulation protocol: Yes   Plan:   Warfarin --INR is subtherapeutic. Will give warfarin 10 mg x 1 again tonight. * patient  noted to have taken Vit K prior to procedure 3/10- Resumption of therapeutic INR is usually delayed due to Vit K administration --Daily INR per protocol  Heparin  03/10 2152 HL 0.32, therapeutic x 1 03/11 0434 HL 0.58, therapeutic x 2 3/12 0416 HL 0.76, supratherapeutic   Plan:   --Decrease heparin to 700 units/hr --Check HL in 8 hours after dose adjustment. --Daily CBC per protocol while on IV heparin --Anticipate bridge for approximately 5 days and until INR therapeutic x 2 days  Renda Rolls, PharmD, [facility] 01/17/2021 6:57 AM

## 2021-01-17 NOTE — Consult Note (Signed)
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   Medical History: Past Medical History:  Diagnosis Date  . CHF (congestive heart failure) ([place])   . Chronic kidney disease    CKD  . COPD (chronic obstructive pulmonary disease) ([place])   . Dysrhythmia    Atrial Fibrillation  . GERD (gastroesophageal reflux disease)   . Hypertension   . 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 Brain MRI concerning for acute infarction. Patient with subtherapeutic INR of 1.4. Patient took vitamin K prior to procedure. Pharmacy has been consulted to initiate heparin infusion.  Baseline CBC within normal limits. Baseline INR 1.4. Add-on aPTT pending.   Date INR Warfarin Dose  3/10 1.4 10 mg  3/11 1.2 10 mg  3/12 1.3         Goal of Therapy:  Heparin level 0.3 - 0.5 units/ml INR 2.5 - 3.5 Monitor platelets by anticoagulation protocol: Yes   Plan:   Warfarin --INR subtherapeutic, expect INR to start to increase in next 24-48 hrs  given 10 mg x 2. Warfarin 7 mg tonight * patient noted to have taken Vit K prior to procedure 3/10- Resumption of therapeutic INR is usually delayed due to Vit K administration --Daily INR per protocol  Heparin  03/10 2152 HL 0.32, therapeutic x 1 03/11 0434 HL 0.58, therapeutic x 2 3/12 0416 HL 0.76, supratherapeutic   Plan:   --Decrease heparin to 700 units/hr --Check HL in 8 hours after dose adjustment. --Daily CBC per protocol while on IV heparin --Anticipate bridge for approximately 5 days and until INR therapeutic x 2 days  Dorena Bodo, PharmD Clinical Pharmacist 01/17/2021 9:04 AM

## 2021-01-18 ENCOUNTER — Encounter: Payer: Self-pay | Admitting: Internal Medicine

## 2021-01-18 LAB — CBC
HCT: 37.4 % (ref 36.0–46.0)
Hemoglobin: 11.9 g/dL — ABNORMAL LOW (ref 12.0–15.0)
MCH: 26.4 pg (ref 26.0–34.0)
MCHC: 31.8 g/dL (ref 30.0–36.0)
MCV: 82.9 fL (ref 80.0–100.0)
Platelets: 183 10*3/uL (ref 150–400)
RBC: 4.51 MIL/uL (ref 3.87–5.11)
RDW: 14 % (ref 11.5–15.5)
WBC: 6.6 10*3/uL (ref 4.0–10.5)
nRBC: 0 % (ref 0.0–0.2)

## 2021-01-18 LAB — PROTIME-INR
INR: 1.7 — ABNORMAL HIGH (ref 0.8–1.2)
Prothrombin Time: 19 seconds — ABNORMAL HIGH (ref 11.4–15.2)

## 2021-01-18 MED ORDER — ENOXAPARIN SODIUM 80 MG/0.8ML ~~LOC~~ SOLN: 1.5000 mg/kg | SUBCUTANEOUS | 4 refills | Status: DC

## 2021-01-18 MED ORDER — ENOXAPARIN SODIUM 100 MG/ML ~~LOC~~ SOLN
1.5000 mg/kg | SUBCUTANEOUS | Status: DC
  Administered 2021-01-18: 97.5 mg via SUBCUTANEOUS
  Filled 2021-01-18: qty 1

## 2021-01-18 MED ORDER — ENOXAPARIN SODIUM 80 MG/0.8ML ~~LOC~~ SOLN: 1.5000 mg/kg | SUBCUTANEOUS | 0 refills | Status: DC

## 2021-01-18 NOTE — Progress Notes (Addendum)
Patient is being discharge home with [facility].  Discharge papers given and explained to pt.  Pt verbalized understanding. Meds and f/u appointment reviewed. Rx given.  Awaiting transportation.  Per Dr. Olevia Bowens patient needs to f/u this week with PCP (f/u on INR). Pt made aware.

## 2021-01-18 NOTE — TOC Transition Note (Signed)
Transition of Care [facility]) - CM/SW Discharge Note   Patient Details  Name: Janice Tyler MRN: [id number] Date of Birth: 08-15-1946  [facility] CM/SW Contact:  Truitt Merle, LCSW Phone Number: 01/18/2021, 3:17 PM   Clinical Narrative:    Patient medically ready for discharge today. Home health already set-up with [facility]. Updated Floydene Flock with [place] of discharge. No other DME equipment needed. RN Malka updated. No additional TOC needs.      Barriers to Discharge: Barriers Resolved   Patient Goals and CMS Choice Patient states their goals for this hospitalization and ongoing recovery are:: For her head to clear up and her INR to get to normal CMS Medicare.gov Compare Post Acute Care list provided to:: Patient Choice offered to / list presented to : Patient  Discharge Placement                       Discharge Plan and Services   Discharge Planning Services: CM Consult Post Acute Care Choice: Home Health          DME Arranged: N/A DME Agency: NA       HH Arranged: PT,OT [place] Agency: [place] ([place]) Date [place]: 01/16/21 Time [place]: 1600 Representative spoke with at [place]: [place] (SDOH) Interventions     Readmission Risk Interventions No flowsheet data found.

## 2021-01-18 NOTE — Discharge Summary (Addendum)
Physician Discharge Summary  Janice Tyler WUJ:[id number] DOB: 26-Mar-1946 DOA: 01/15/2021  PCP: Kirk Ruths, MD  Admit date: 01/15/2021 Discharge date: 01/18/2021  Admitted From: Home Disposition:  Home  Recommendations for Outpatient Follow-up:  1. Follow up with PCP in 1-2 weeks 2. Please obtain BMP/CBC in one week   Home Health:No Equipment/Devices:None  Discharge Condition:Stable CODE STATUS:Full Diet recommendation: Heart Healthy  Brief/Interim Summary: 75 y.o. 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  As per husband she was having a lumbar radioablation procedure on the day of admission she did well on the right side wound which she got moved to the left side she had a lot of pain was given sedation then she became slurry and drowsy did not loss of consciousness.  When the admitting physician saw the patient she was back to normal.  Her INR was 1.4 before the procedure.  Discharge Diagnoses:  Principal Problem:   Acute metabolic encephalopathy Active Problems:   COPD (chronic obstructive pulmonary disease) (HCC)   Essential (primary) hypertension   Mechanical heart valve present   Slurred speech   HLD (hyperlipidemia)   CAD (coronary artery disease)   GERD (gastroesophageal reflux disease)   Seizure (HCC)   Chronic diastolic CHF (congestive heart failure) (HCC) Slurred speech likely due to polypharmacy: From her history is all happened after she was given medication and resolved when she came into the ED neurology was consulted MRI was negative. They recommended to continue her current medication physical therapy evaluated the patient and recommended home health PT.  COPD: Continue inhalers stable no changes made.  Essential hypertension: No changes made to her  medication.  Mechanical heart valve with a subtherapeutic INR: On admission to the hospital she was placed on IV heparin and started on Coumadin once her INR started to trend up she was changed to Lovenox once a day should continue as an outpatient for 5 days. She will follow up with Dr. Brigitte Pulse this week to follow-up with her INR.  Hyperlipidemia: Continue statins.  Seizure disorder: Continue Lamictal no changes made to her medication.     Discharge Instructions  Discharge Instructions    Diet - low sodium heart healthy   Complete by: As directed    Increase activity slowly   Complete by: As directed      Allergies as of 01/18/2021   No Known Allergies     Medication List    TAKE these medications   acetaminophen 500 MG tablet Commonly known as: TYLENOL Take 500 mg by mouth every 6 (six) hours as needed.   albuterol 108 (90 Base) MCG/ACT inhaler Commonly known as: VENTOLIN HFA Inhale 1 puff into the lungs every 6 (six) hours as needed for wheezing or shortness of breath.   amLODipine 5 MG tablet Commonly known as: NORVASC Take 5 mg by mouth daily.   aspirin EC 81 MG tablet Take 81 mg by mouth daily.   atorvastatin 40 MG tablet Commonly known as: LIPITOR Take 40 mg by mouth at bedtime.   Cholecalciferol 50 MCG (2000 UT) Tabs Take by mouth.   enoxaparin 80 MG/0.8ML injection Commonly known as: LOVENOX Inject 0.975 mLs (97.5 mg total) into the skin daily for 5 days.   fluocinolone 0.01 % external solution Commonly known as: SYNALAR Apply 1 application topically 2 (two) times daily.   furosemide 40 MG  tablet Commonly known as: LASIX Take 40 mg by mouth daily.   ketoconazole 2 % shampoo Commonly known as: NIZORAL Apply 1 application topically 2 (two) times a week.   lamoTRIgine 150 MG tablet Commonly known as: LAMICTAL Take 150 mg by mouth 2 (two) times daily.   metoprolol tartrate 50 MG tablet Commonly known as: LOPRESSOR Take 50 mg by mouth 2 (two)  times daily.   pantoprazole 40 MG tablet Commonly known as: PROTONIX Take 40 mg by mouth daily.   Trelegy Ellipta 100-62.5-25 MCG/INH Aepb Generic drug: Fluticasone-Umeclidin-Vilant Inhale 1 puff into the lungs daily.   Vitamin B12 1000 MCG Tbcr   warfarin 1 MG tablet Commonly known as: COUMADIN Take 1 mg by mouth daily. Take with 2 (3 mg) warfarin tablets to equal 7 mg   warfarin 3 MG tablet Commonly known as: COUMADIN Take 6 mg by mouth daily. Take with 1 mg to equal 7 mg       No Known Allergies  Consultations:  Neurology   Procedures/Studies: CT Head Wo Contrast  Result Date: 01/15/2021 CLINICAL DATA:  75 year old female with slurred speech, altered mental status. EXAM: CT HEAD WITHOUT CONTRAST TECHNIQUE: Contiguous axial images were obtained from the base of the skull through the vertex without intravenous contrast. COMPARISON:  None. FINDINGS: Brain: No midline shift, ventriculomegaly, mass effect, evidence of mass lesion, intracranial hemorrhage or evidence of cortically based acute infarction. Gray-white matter differentiation is within normal limits for age in both hemispheres. Some generalized cerebral volume loss but no encephalomalacia identified. Vascular: Calcified atherosclerosis at the skull base. No suspicious intracranial vascular hyperdensity. Skull: Negative. Sinuses/Orbits: Visualized paranasal sinuses and mastoids are clear. Other: Visualized orbits and scalp soft tissues are within normal limits. IMPRESSION: No acute intracranial abnormality. Negative for age non contrast CT appearance of the brain. Electronically Signed   By: Genevie Ann M.D.   On: 01/15/2021 11:45   MR BRAIN WO CONTRAST  Result Date: 01/15/2021 CLINICAL DATA:  Development of slurred speech during medical procedure. Possible seizure. EXAM: MRI HEAD WITHOUT CONTRAST TECHNIQUE: Multiplanar, multiecho pulse sequences of the brain and surrounding structures were obtained without intravenous  contrast. COMPARISON:  Head CT same day FINDINGS: Brain: The brainstem is normal. 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 Cerebral hemispheres show mild chronic small-vessel ischemic change of the white matter. Diffusion imaging raises the possibility of a punctate acute white matter infarction in the corona radiography on the left, diffusion axial image 31. This cannot be confirmed on the coronal exam however and is therefore questionable. No cortical or large vessel territory infarction. No mass lesion, hemorrhage, hydrocephalus or extra-axial collection. No postictal phenomenon is visible. Mesial temporal lobes are normal. Vascular: Major vessels at the base of the brain show flow. Skull and upper cervical spine: Negative Sinuses/Orbits: Clear/normal Other: None IMPRESSION: 1. Question punctate acute white matter infarction in the corona radiography on the left, diffusion axial image 31. This cannot be confirmed on the coronal diffusion exam however and is therefore questionable. 2. Mild chronic small-vessel ischemic change of the cerebellum and cerebral hemispheric white matter. 3. No abnormality seen that would be expected to cause seizure. No evidence of postictal phenomenon. Electronically Signed   By: Nelson Chimes M.D.   On: 01/15/2021 14:09   (Echo, Carotid, EGD, Colonoscopy, ERCP)    Subjective: No new complaints  Discharge Exam: Vitals:   01/18/21 1118 01/18/21 1119  BP: (!) 169/69 (!) 156/57  Pulse: (!) 59 (!) 58  Resp:  16   Temp: 97.8 F (36.6 C)   SpO2: (!) 89% 97%   Vitals:   01/18/21 0746 01/18/21 0925 01/18/21 1118 01/18/21 1119  BP: 138/79  (!) 169/69 (!) 156/57  Pulse: (!) 51 (!) 52 (!) 59 (!) 58  Resp: 16  16   Temp: 98.3 F (36.8 C)  97.8 F (36.6 C)   TempSrc:      SpO2: 99%  (!) 89% 97%  Weight:      Height:        General: Pt is alert, awake, not in acute distress Cardiovascular: RRR, S1/S2 +, no rubs, no gallops Respiratory: CTA bilaterally,  no wheezing, no rhonchi Abdominal: Soft, NT, ND, bowel sounds + Extremities: no edema, no cyanosis    The results of significant diagnostics from this hospitalization (including imaging, microbiology, ancillary and laboratory) are listed below for reference.     Microbiology: Recent Results (from the past 240 hour(s))  SARS CORONAVIRUS 2 (TAT 6-24 HRS) Nasopharyngeal Nasopharyngeal Swab     Status: None   Collection Time: 01/15/21  2:56 PM   Specimen: Nasopharyngeal Swab  Result Value Ref Range Status   SARS Coronavirus 2 NEGATIVE NEGATIVE Final    Comment: (NOTE) SARS-CoV-2 target nucleic acids are NOT DETECTED.  The SARS-CoV-2 RNA is generally detectable in upper and lower respiratory specimens during the acute phase of infection. Negative results do not preclude SARS-CoV-2 infection, do not rule out co-infections with other pathogens, and should not be used as the sole basis for treatment or other patient management decisions. Negative results must be combined with clinical observations, patient history, and epidemiological information. The expected result is Negative.  Fact Sheet for Patients: SugarRoll.be  Fact Sheet for Healthcare Providers: https://www.woods-mathews.com/  This test is not yet approved or cleared by the [place] FDA and  has been authorized for detection and/or diagnosis of SARS-CoV-2 by FDA under an Emergency Use Authorization (EUA). This EUA will remain  in effect (meaning this test can be used) for the duration of the COVID-19 declaration under Se ction 564(b)(1) of the Act, 21 U.S.C. section 360bbb-3(b)(1), unless the authorization is terminated or revoked sooner.  Performed at [facility], [place] [address] [place], [place] [address]      Labs: BNP (last 3 results) Recent Labs    01/15/21 1023  BNP 768.1*   Basic Metabolic Panel: Recent Labs  Lab 01/15/21 1023  NA 140  K 3.8  CL  105  CO2 28  GLUCOSE 123*  BUN 16  CREATININE 0.90  CALCIUM 9.8   Liver Function Tests: No results for input(s): AST, ALT, ALKPHOS, BILITOT, PROT, ALBUMIN in the last 168 hours. No results for input(s): LIPASE, AMYLASE in the last 168 hours. No results for input(s): AMMONIA in the last 168 hours. CBC: Recent Labs  Lab 01/15/21 1023 01/16/21 0434 01/17/21 0416 01/18/21 0436  WBC 6.4 6.6 7.3 6.6  NEUTROABS 5.0  --   --   --   HGB 12.3 11.4* 11.1* 11.9*  HCT 39.9 36.2 35.7* 37.4  MCV 83.5 82.3 83.6 82.9  PLT 187 168 162 183   Cardiac Enzymes: No results for input(s): CKTOTAL, CKMB, CKMBINDEX, TROPONINI in the last 168 hours. BNP: Invalid input(s): POCBNP CBG: No results for input(s): GLUCAP in the last 168 hours. D-Dimer No results for input(s): DDIMER in the last 72 hours. Hgb A1c Recent Labs    01/16/21 0434  HGBA1C 6.4*   Lipid Profile Recent Labs  01/16/21 0434  CHOL 148  HDL 76  LDLCALC 62  TRIG 51  CHOLHDL 1.9   Thyroid function studies No results for input(s): TSH, T4TOTAL, T3FREE, THYROIDAB in the last 72 hours.  Invalid input(s): FREET3 Anemia work up No results for input(s): VITAMINB12, FOLATE, FERRITIN, TIBC, IRON, RETICCTPCT in the last 72 hours. Urinalysis    Component Value Date/Time   COLORURINE YELLOW (A) 09/30/2018 1148   APPEARANCEUR CLEAR (A) 09/30/2018 1148   LABSPEC 1.020 09/30/2018 1148   PHURINE 5.0 09/30/2018 1148   GLUCOSEU NEGATIVE 09/30/2018 1148   HGBUR NEGATIVE 09/30/2018 1148   BILIRUBINUR NEGATIVE 09/30/2018 1148   KETONESUR NEGATIVE 09/30/2018 1148   PROTEINUR 100 (A) 09/30/2018 1148   NITRITE NEGATIVE 09/30/2018 1148   LEUKOCYTESUR NEGATIVE 09/30/2018 1148   Sepsis Labs Invalid input(s): PROCALCITONIN,  WBC,  LACTICIDVEN Microbiology Recent Results (from the past 240 hour(s))  SARS CORONAVIRUS 2 (TAT 6-24 HRS) Nasopharyngeal Nasopharyngeal Swab     Status: None   Collection Time: 01/15/21  2:56 PM   Specimen:  Nasopharyngeal Swab  Result Value Ref Range Status   SARS Coronavirus 2 NEGATIVE NEGATIVE Final    Comment: (NOTE) SARS-CoV-2 target nucleic acids are NOT DETECTED.  The SARS-CoV-2 RNA is generally detectable in upper and lower respiratory specimens during the acute phase of infection. Negative results do not preclude SARS-CoV-2 infection, do not rule out co-infections with other pathogens, and should not be used as the sole basis for treatment or other patient management decisions. Negative results must be combined with clinical observations, patient history, and epidemiological information. The expected result is Negative.  Fact Sheet for Patients: SugarRoll.be  Fact Sheet for Healthcare Providers: https://www.woods-mathews.com/  This test is not yet approved or cleared by the [place] FDA and  has been authorized for detection and/or diagnosis of SARS-CoV-2 by FDA under an Emergency Use Authorization (EUA). This EUA will remain  in effect (meaning this test can be used) for the duration of the COVID-19 declaration under Se ction 564(b)(1) of the Act, 21 U.S.C. section 360bbb-3(b)(1), unless the authorization is terminated or revoked sooner.  Performed at [facility], [place] [address], [place], [place] [address]      Time coordinating discharge: Over 30 minutes  SIGNED:   Charlynne Cousins, MD  [facility] 01/18/2021, 12:53 PM Pager   If 7PM-7AM, please contact night-coverage [email or web address] Password [email or web address]

## 2021-01-22 DIAGNOSIS — N183 Chronic kidney disease, stage 3 unspecified: Secondary | ICD-10-CM | POA: Diagnosis present

## 2021-01-23 ENCOUNTER — Emergency Department: Payer: Medicare Other

## 2021-01-23 ENCOUNTER — Other Ambulatory Visit: Payer: Self-pay

## 2021-01-23 ENCOUNTER — Inpatient Hospital Stay
Admission: EM | Admit: 2021-01-23 | Discharge: 2021-01-27 | DRG: 920 | Disposition: A | Discharge status: Home Health | Payer: Medicare Other | Attending: Internal Medicine | Admitting: Internal Medicine

## 2021-01-23 DIAGNOSIS — E785 Hyperlipidemia, unspecified: Secondary | ICD-10-CM | POA: Diagnosis present

## 2021-01-23 DIAGNOSIS — Z87891 Personal history of nicotine dependence: Secondary | ICD-10-CM

## 2021-01-23 DIAGNOSIS — Z96643 Presence of artificial hip joint, bilateral: Secondary | ICD-10-CM | POA: Diagnosis present

## 2021-01-23 DIAGNOSIS — E1122 Type 2 diabetes mellitus with diabetic chronic kidney disease: Secondary | ICD-10-CM | POA: Diagnosis present

## 2021-01-23 DIAGNOSIS — Z952 Presence of prosthetic heart valve: Secondary | ICD-10-CM | POA: Diagnosis not present

## 2021-01-23 DIAGNOSIS — Z79899 Other long term (current) drug therapy: Secondary | ICD-10-CM | POA: Diagnosis not present

## 2021-01-23 DIAGNOSIS — G40909 Epilepsy, unspecified, not intractable, without status epilepticus: Secondary | ICD-10-CM | POA: Diagnosis present

## 2021-01-23 DIAGNOSIS — T148XXA Other injury of unspecified body region, initial encounter: Secondary | ICD-10-CM

## 2021-01-23 DIAGNOSIS — R569 Unspecified convulsions: Secondary | ICD-10-CM | POA: Diagnosis not present

## 2021-01-23 DIAGNOSIS — Z7951 Long term (current) use of inhaled steroids: Secondary | ICD-10-CM

## 2021-01-23 DIAGNOSIS — E1169 Type 2 diabetes mellitus with other specified complication: Secondary | ICD-10-CM | POA: Diagnosis present

## 2021-01-23 DIAGNOSIS — R791 Abnormal coagulation profile: Secondary | ICD-10-CM | POA: Diagnosis present

## 2021-01-23 DIAGNOSIS — G8929 Other chronic pain: Secondary | ICD-10-CM

## 2021-01-23 DIAGNOSIS — M549 Dorsalgia, unspecified: Secondary | ICD-10-CM | POA: Diagnosis present

## 2021-01-23 DIAGNOSIS — Y848 Other medical procedures as the cause of abnormal reaction of the patient, or of later complication, without mention of misadventure at the time of the procedure: Secondary | ICD-10-CM | POA: Diagnosis present

## 2021-01-23 DIAGNOSIS — J449 Chronic obstructive pulmonary disease, unspecified: Secondary | ICD-10-CM | POA: Diagnosis present

## 2021-01-23 DIAGNOSIS — Z7982 Long term (current) use of aspirin: Secondary | ICD-10-CM | POA: Diagnosis not present

## 2021-01-23 DIAGNOSIS — R222 Localized swelling, mass and lump, trunk: Secondary | ICD-10-CM

## 2021-01-23 DIAGNOSIS — R509 Fever, unspecified: Secondary | ICD-10-CM | POA: Diagnosis not present

## 2021-01-23 DIAGNOSIS — Z96653 Presence of artificial knee joint, bilateral: Secondary | ICD-10-CM | POA: Diagnosis present

## 2021-01-23 DIAGNOSIS — N1831 Chronic kidney disease, stage 3a: Secondary | ICD-10-CM | POA: Diagnosis present

## 2021-01-23 DIAGNOSIS — K219 Gastro-esophageal reflux disease without esophagitis: Secondary | ICD-10-CM | POA: Diagnosis present

## 2021-01-23 DIAGNOSIS — I10 Essential (primary) hypertension: Secondary | ICD-10-CM | POA: Diagnosis not present

## 2021-01-23 DIAGNOSIS — I5032 Chronic diastolic (congestive) heart failure: Secondary | ICD-10-CM | POA: Diagnosis present

## 2021-01-23 DIAGNOSIS — Z7901 Long term (current) use of anticoagulants: Secondary | ICD-10-CM | POA: Diagnosis not present

## 2021-01-23 DIAGNOSIS — L0291 Cutaneous abscess, unspecified: Secondary | ICD-10-CM

## 2021-01-23 DIAGNOSIS — I130 Hypertensive heart and chronic kidney disease with heart failure and stage 1 through stage 4 chronic kidney disease, or unspecified chronic kidney disease: Secondary | ICD-10-CM | POA: Diagnosis present

## 2021-01-23 DIAGNOSIS — I4891 Unspecified atrial fibrillation: Secondary | ICD-10-CM | POA: Diagnosis present

## 2021-01-23 DIAGNOSIS — L7632 Postprocedural hematoma of skin and subcutaneous tissue following other procedure: Secondary | ICD-10-CM | POA: Diagnosis present

## 2021-01-23 DIAGNOSIS — M545 Low back pain, unspecified: Secondary | ICD-10-CM | POA: Diagnosis not present

## 2021-01-23 DIAGNOSIS — Z90710 Acquired absence of both cervix and uterus: Secondary | ICD-10-CM | POA: Diagnosis not present

## 2021-01-23 DIAGNOSIS — Z20822 Contact with and (suspected) exposure to covid-19: Secondary | ICD-10-CM | POA: Diagnosis present

## 2021-01-23 LAB — PROTIME-INR
INR: 1.8 — ABNORMAL HIGH (ref 0.8–1.2)
Prothrombin Time: 20.4 seconds — ABNORMAL HIGH (ref 11.4–15.2)

## 2021-01-23 LAB — COMPREHENSIVE METABOLIC PANEL
ALT: 76 U/L — ABNORMAL HIGH (ref 0–44)
AST: 70 U/L — ABNORMAL HIGH (ref 15–41)
Albumin: 4.5 g/dL (ref 3.5–5.0)
Alkaline Phosphatase: 88 U/L (ref 38–126)
Anion gap: 12 (ref 5–15)
BUN: 23 mg/dL (ref 8–23)
CO2: 19 mmol/L — ABNORMAL LOW (ref 22–32)
Calcium: 10.2 mg/dL (ref 8.9–10.3)
Chloride: 101 mmol/L (ref 98–111)
Creatinine, Ser: 0.99 mg/dL (ref 0.44–1.00)
GFR, Estimated: 59 mL/min — ABNORMAL LOW (ref >60)
Glucose, Bld: 215 mg/dL — ABNORMAL HIGH (ref 70–99)
Potassium: 4.2 mmol/L (ref 3.5–5.1)
Sodium: 132 mmol/L — ABNORMAL LOW (ref 135–145)
Total Bilirubin: 1 mg/dL (ref 0.3–1.2)
Total Protein: 7.9 g/dL (ref 6.5–8.1)

## 2021-01-23 LAB — CBC
HCT: 37.8 % (ref 36.0–46.0)
Hemoglobin: 12.1 g/dL (ref 12.0–15.0)
MCH: 26.2 pg (ref 26.0–34.0)
MCHC: 32 g/dL (ref 30.0–36.0)
MCV: 82 fL (ref 80.0–100.0)
Platelets: 220 10*3/uL (ref 150–400)
RBC: 4.61 MIL/uL (ref 3.87–5.11)
RDW: 14.1 % (ref 11.5–15.5)
WBC: 15.8 10*3/uL — ABNORMAL HIGH (ref 4.0–10.5)
nRBC: 0 % (ref 0.0–0.2)

## 2021-01-23 LAB — RESP PANEL BY RT-PCR (FLU A&B, COVID) ARPGX2
Influenza A by PCR: NEGATIVE
Influenza B by PCR: NEGATIVE
SARS Coronavirus 2 by RT PCR: NEGATIVE

## 2021-01-23 LAB — APTT: aPTT: 38 seconds — ABNORMAL HIGH (ref 24–36)

## 2021-01-23 LAB — LIPASE, BLOOD: Lipase: 46 U/L (ref 11–51)

## 2021-01-23 MED ORDER — PANTOPRAZOLE SODIUM 40 MG PO TBEC
40.0000 mg | DELAYED_RELEASE_TABLET | Freq: Every day | ORAL | Status: DC
  Administered 2021-01-23 – 2021-01-27 (×5): 40 mg via ORAL
  Filled 2021-01-23 (×5): qty 1

## 2021-01-23 MED ORDER — AMLODIPINE BESYLATE 5 MG PO TABS
5.0000 mg | ORAL_TABLET | Freq: Every day | ORAL | Status: DC
  Administered 2021-01-23 – 2021-01-27 (×5): 5 mg via ORAL
  Filled 2021-01-23 (×5): qty 1

## 2021-01-23 MED ORDER — UMECLIDINIUM BROMIDE 62.5 MCG/INH IN AEPB
1.0000 | INHALATION_SPRAY | Freq: Every day | RESPIRATORY_TRACT | Status: DC
  Administered 2021-01-26 – 2021-01-27 (×2): 1 via RESPIRATORY_TRACT
  Filled 2021-01-23 (×2): qty 7

## 2021-01-23 MED ORDER — LAMOTRIGINE 100 MG PO TABS
150.0000 mg | ORAL_TABLET | Freq: Two times a day (BID) | ORAL | Status: DC
  Administered 2021-01-23 – 2021-01-27 (×8): 150 mg via ORAL
  Filled 2021-01-23: qty 1.5
  Filled 2021-01-23: qty 1
  Filled 2021-01-23 (×2): qty 1.5
  Filled 2021-01-23: qty 1
  Filled 2021-01-23: qty 6
  Filled 2021-01-23: qty 1.5
  Filled 2021-01-23 (×2): qty 6
  Filled 2021-01-23: qty 1.5
  Filled 2021-01-23: qty 6
  Filled 2021-01-23: qty 1.5
  Filled 2021-01-23 (×2): qty 6

## 2021-01-23 MED ORDER — ATORVASTATIN CALCIUM 20 MG PO TABS
40.0000 mg | ORAL_TABLET | Freq: Every day | ORAL | Status: DC
  Administered 2021-01-23 – 2021-01-26 (×4): 40 mg via ORAL
  Filled 2021-01-23 (×4): qty 2

## 2021-01-23 MED ORDER — METOPROLOL TARTRATE 50 MG PO TABS
50.0000 mg | ORAL_TABLET | Freq: Two times a day (BID) | ORAL | Status: DC
  Administered 2021-01-23 – 2021-01-27 (×8): 50 mg via ORAL
  Filled 2021-01-23 (×8): qty 1

## 2021-01-23 MED ORDER — ONDANSETRON HCL 4 MG/2ML IJ SOLN
4.0000 mg | Freq: Once | INTRAMUSCULAR | Status: AC
  Administered 2021-01-23: 4 mg via INTRAVENOUS
  Filled 2021-01-23: qty 2

## 2021-01-23 MED ORDER — WARFARIN - PHARMACIST DOSING INPATIENT
Freq: Every day | Status: DC
  Filled 2021-01-23: qty 1

## 2021-01-23 MED ORDER — FLUTICASONE-UMECLIDIN-VILANT 100-62.5-25 MCG/INH IN AEPB: 1.0000 | INHALATION_SPRAY | Freq: Every day | RESPIRATORY_TRACT | Status: DC

## 2021-01-23 MED ORDER — FLUTICASONE FUROATE-VILANTEROL 100-25 MCG/INH IN AEPB
1.0000 | INHALATION_SPRAY | Freq: Every day | RESPIRATORY_TRACT | Status: DC
  Administered 2021-01-26 – 2021-01-27 (×2): 1 via RESPIRATORY_TRACT
  Filled 2021-01-23 (×2): qty 28

## 2021-01-23 MED ORDER — HYDROMORPHONE HCL 1 MG/ML IJ SOLN
0.5000 mg | Freq: Once | INTRAMUSCULAR | Status: AC
Start: 2021-01-23 — End: 2021-01-23
  Administered 2021-01-23: 0.5 mg via INTRAVENOUS
  Filled 2021-01-23: qty 1

## 2021-01-23 MED ORDER — HYDROMORPHONE HCL 1 MG/ML IJ SOLN
0.5000 mg | Freq: Once | INTRAMUSCULAR | Status: AC
  Administered 2021-01-23: 0.5 mg via INTRAVENOUS
  Filled 2021-01-23: qty 1

## 2021-01-23 NOTE — ED Provider Notes (Signed)
[facility] Emergency Department Provider Note  ____________________________________________   Event Date/Time   First MD Initiated Contact with Patient 01/23/21 1559     (approximate)  I have reviewed the triage vital signs and the nursing notes.   HISTORY  Chief Complaint Abdominal Pain    HPI Janice Tyler is a 75 y.o. 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  Patient reports having right lateral back pain that radiates down the lateral side of her leg into her right knee.  The pain is constant, severe, nothing makes it better and worse with trying to move it.  Patient was seen by Dr. Sharlet Salina on Wednesday.  On review of records patient was scheduled for a MBB/RFA to the bilateral L4-L5 and L5-S1 facet joints.  Patient had the procedure done on the right side without any difficulty but then when they tried to do the left side she developed the slurred speech and decreased consciousness.  Which is what prompted her to come into the ER today.  This pain started on 01/17/2021 they started her on a 12-day taper of prednisone as well as Norco but patient states that the [place] makes her fall asleep for an hour and when she wakes up she is still in pain.  Denies any bowel or bladder dysfunction, rectal numbness.  Does have increased weakness in her right leg secondary to pain   On review of records she had had a procedure for lumbar radioablation and got a lot of sedation meds and that that she became slurry and drowsy                Past Medical History:  Diagnosis Date  . CHF (congestive heart failure) ([place])   . Chronic kidney disease    CKD  . COPD (chronic obstructive pulmonary disease) ([place])   . Dysrhythmia    Atrial Fibrillation  . GERD (gastroesophageal reflux disease)   . Hypertension   . Seizures [facility])     Patient Active  Problem List   Diagnosis Date Noted  . CKD (chronic kidney disease), stage IIIa 01/22/2021  . Acute metabolic encephalopathy 75/17/0017  . Stroke ([place]) 01/15/2021  . Slurred speech 01/15/2021  . HLD (hyperlipidemia) 01/15/2021  . CAD (coronary artery disease) 01/15/2021  . GERD (gastroesophageal reflux disease) 01/15/2021  . Seizure ([place]) 01/15/2021  . Chronic diastolic CHF (congestive heart failure) ([place]) 01/15/2021  . Absence seizures, intractable ([place]) 08/26/2020  . Acid reflux disease 08/26/2020  . COPD (chronic obstructive pulmonary disease) ([place]) 08/26/2020  . 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 Joint replaced 08/26/2020  . 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 Osteoporosis 08/26/2020  . Carotid bruit 08/26/2020  . Use of cane as ambulatory aid 08/23/2018  . Controlled type 2 diabetes mellitus with complication, without long-term current use of insulin ([place]) 02/21/2018  . Nonintractable epilepsy without status epilepticus ([place]) 06/06/2017  . Peripheral neuropathy, idiopathic 06/06/2017  . Aortic atherosclerosis ([place]) 04/12/2017  . Coronary artery disease involving native coronary artery of native heart without angina pectoris 04/12/2017  . Healthcare maintenance 01/06/2017  . Chronic bronchitis ([place]) 09/28/2016  . Essential (primary) hypertension 09/28/2016  . Hypercholesterolemia 09/28/2016  . Incidental lung nodule 09/28/2016  . 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 Urinary incontinence in female 11/08/2008    Past Surgical History:  Procedure Laterality Date  . ABDOMINAL HYSTERECTOMY    .  AORTIC VALVE REPLACEMENT    . CARDIAC VALVE REPLACEMENT    . COLONOSCOPY WITH PROPOFOL N/A 06/18/2019   Procedure: COLONOSCOPY WITH PROPOFOL;  Surgeon: Toledo, Benay Pike, MD;  Location: [facility] ENDOSCOPY;  Service: Gastroenterology;  Laterality: N/A;  . OOPHORECTOMY    . REPLACEMENT TOTAL KNEE Bilateral   . TOTAL HIP ARTHROPLASTY Bilateral   .  TUBAL LIGATION      Prior to Admission medications   Medication Sig Start Date End Date Taking? Authorizing Provider  acetaminophen (TYLENOL) 500 MG tablet Take 500 mg by mouth every 6 (six) hours as needed.    [provider]  albuterol (VENTOLIN HFA) 108 (90 Base) MCG/ACT inhaler Inhale 1 puff into the lungs every 6 (six) hours as needed for wheezing or shortness of breath.    [provider]  amLODipine (NORVASC) 5 MG tablet Take 5 mg by mouth daily.    [provider]  aspirin EC 81 MG tablet Take 81 mg by mouth daily.    [provider]  atorvastatin (LIPITOR) 40 MG tablet Take 40 mg by mouth at bedtime.    [provider]  Cholecalciferol 50 MCG (2000 UT) TABS Take by mouth.    [provider]  Cyanocobalamin (VITAMIN B12) 1000 MCG TBCR     [provider]  enoxaparin (LOVENOX) 80 MG/0.8ML injection Inject 0.975 mLs (97.5 mg total) into the skin daily for 5 days. 01/18/21 01/23/21  Charlynne Cousins, MD  fluocinolone (SYNALAR) 0.01 % external solution Apply 1 application topically 2 (two) times daily. 06/11/20   [provider]  furosemide (LASIX) 40 MG tablet Take 40 mg by mouth daily.    [provider]  ketoconazole (NIZORAL) 2 % shampoo Apply 1 application topically 2 (two) times a week. 08/03/20   [provider]  lamoTRIgine (LAMICTAL) 150 MG tablet Take 150 mg by mouth 2 (two) times daily. 09/15/20   [provider]  metoprolol tartrate (LOPRESSOR) 50 MG tablet Take 50 mg by mouth 2 (two) times daily.    [provider]  pantoprazole (PROTONIX) 40 MG tablet Take 40 mg by mouth daily.    [provider]  TRELEGY ELLIPTA 100-62.5-25 MCG/INH AEPB Inhale 1 puff into the lungs daily. 12/18/20   [provider]  warfarin (COUMADIN) 1 MG tablet Take 1 mg by mouth daily. Take with 2 (3 mg) warfarin tablets to equal 7 mg    [provider]  warfarin (COUMADIN) 3  MG tablet Take 6 mg by mouth daily. Take with 1 mg to equal 7 mg    [provider]    Allergies Patient has no known allergies.  Family History  Problem Relation Age of Onset  . Breast cancer Neg Hx     Social History Social History   Tobacco Use  . Smoking status: Former Smoker    Packs/day: 1.00    Years: 42.00    Pack years: 42.00    Quit date: 2015    Years since quitting: 7.2  . Smokeless tobacco: Never Used  Substance Use Topics  . Alcohol use: Never  . Drug use: Never      Review of Systems Constitutional: No fever/chills Eyes: No visual changes. ENT: No sore throat. Cardiovascular: Denies chest pain. Respiratory: Denies shortness of breath. Gastrointestinal: No abdominal pain.  No nausea, no vomiting.  No diarrhea.  No constipation. Genitourinary: Negative for dysuria. Musculoskeletal: Negative for back pain.  Right hip pain Skin: Negative for rash. Neurological:  Negative for headaches, focal weakness or numbness. All other ROS negative ____________________________________________   PHYSICAL EXAM:  VITAL SIGNS: ED Triage Vitals  Enc Vitals Group     BP 01/23/21 1343 (!) 170/78     Pulse Rate 01/23/21 1343 87     Resp 01/23/21 1343 18     Temp 01/23/21 1343 98.7 F (37.1 C)     Temp Source 01/23/21 1343 Oral     SpO2 01/23/21 1343 97 %     Weight 01/23/21 1342 145 lb (65.8 kg)     Height 01/23/21 1342 5' 2.5" (1.588 m)     Head Circumference --      Peak Flow --      Pain Score 01/23/21 1342 10     Pain Loc --      Pain Edu? --      Excl. in [place]? --     Constitutional: Alert and oriented. Well appearing and in no acute distress. Eyes: Conjunctivae are normal. EOMI. Head: Atraumatic. Nose: No congestion/rhinnorhea. Mouth/Throat: Mucous membranes are moist.   Neck: No stridor. Trachea Midline. FROM Cardiovascular: Normal rate, regular rhythm. Grossly normal heart sounds.  Good peripheral circulation. Respiratory: Normal  respiratory effort.  No retractions. Lungs CTAB. Gastrointestinal: Soft and nontender. No distention. No abdominal bruits.  Musculoskeletal: Patient reports pain on the right buttock radiating down into her knee.  Not significantly tender with touching but worse when she tries to move the leg.  No swelling of the leg.  2+ distal pulse Neurologic:  Normal speech and language. No gross focal neurologic deficits are appreciated.  Skin:  Skin is warm, dry and intact. No rash noted. Psychiatric: Mood and affect are normal. Speech and behavior are normal. GU: Deferred   ____________________________________________   LABS (all labs ordered are listed, but only abnormal results are displayed)  Labs Reviewed  COMPREHENSIVE METABOLIC PANEL - Abnormal; Notable for the following components:      Result Value   Sodium 132 (*)    CO2 19 (*)    Glucose, Bld 215 (*)    AST 70 (*)    ALT 76 (*)    GFR, Estimated 59 (*)    All other components within normal limits  CBC - Abnormal; Notable for the following components:   WBC 15.8 (*)    All other components within normal limits  LIPASE, BLOOD  URINALYSIS, COMPLETE (UACMP) WITH MICROSCOPIC   ____________________________________________  RADIOLOGY Robert Bellow, personally viewed and evaluated these images (plain radiographs) as part of my medical decision making, as well as reviewing the written report by the radiologist.  ED MD interpretation:  zray no fracture  Official radiology report(s): No results found.  ____________________________________________   PROCEDURES  Procedure(s) performed (including Critical Care):  Procedures   ____________________________________________   INITIAL IMPRESSION / ASSESSMENT AND PLAN / ED COURSE  Janice Tyler was evaluated in Emergency Department on 01/23/2021 for the symptoms described in the history of present illness. She was evaluated in the context of the global COVID-19 pandemic, which  necessitated consideration that the patient might be at risk for infection with the SARS-CoV-2 virus that causes COVID-19. Institutional protocols and algorithms that pertain to the evaluation of patients at risk for COVID-19 are in a state of rapid change based on information released by regulatory bodies including the CDC and federal and state organizations. These policies and algorithms were followed during the patient's care in the ED.     Patient is a 75 year old  who comes in with right buttock pain radiating down into her right leg.  Contrary to the triage note is not abdominal pain it is buttock pain and this is consistent with the pain that was described by her PMR doctor.  However patient states that she can now not ambulate secondary to the pain and is coming to the ER requesting some kind of procedure done to help fix it.  Patient is already been on a steroid taper and prescribed hydrocodone.  She has no abdominal tenderness to suggest intra-abdominal infection such as appendicitis, diverticulitis.  She had labs drawn in triage that shows slight white count elevation by suspect this is from the steroid use.  She has good distal pulse I have low suspicion for vascular issue and no swelling of the leg to suggest DVT.   Discussed with Dr Morton Stall he cannot do any procedures here in the hospital.  She not had any MRI imaging for a few years and he recommended getting an MRI as well as also considering that this could be related to the hip as well and to proceed with x-ray    1. In the right paraspinal musculature (longissimus thoracis and iliocostalis lumborum) muscles extending from L2 through L5-S1, a 4.1 by 3.9 by 13.0 cm (volume = 110 cm^3) masslike structure with high T2 and low T1 signal characteristics potentially representing hematoma or abscess. 2. Lumbar spondylosis and degenerative disc disease, causing mild to moderate impingement at L5-S1 and mild impingement at  L4-5.   Discussed with Dr. Robert Bellow who will potentially do IR procedure tomorrow.  Although I did inform him that patient is on warfarin but INR was subtherapeutic at 1.8  I think most likely this is a hematoma although her white count is elevated could just be from the steroids.  I did discuss the hospital team for admission due to pain control difficulty with ambulating  ____________________________________________   FINAL CLINICAL IMPRESSION(S) / ED DIAGNOSES   Final diagnoses:  Paraspinal mass      MEDICATIONS GIVEN DURING THIS VISIT:  Medications  HYDROmorphone (DILAUDID) injection 0.5 mg (0.5 mg Intravenous Given 01/23/21 1718)  ondansetron (ZOFRAN) injection 4 mg (4 mg Intravenous Given 01/23/21 1718)  HYDROmorphone (DILAUDID) injection 0.5 mg (0.5 mg Intravenous Given 01/23/21 1929)     ED Discharge Orders    None       Note:  This document was prepared using Dragon voice recognition software and may include unintentional dictation errors.   Vanessa Davenport, MD 01/23/21 2049

## 2021-01-23 NOTE — ED Triage Notes (Signed)
Pt comes with c/o right sided abdominal pain that radiates down her leg and to her back. Pt states she had a procedure completed recently and need up being hospitalized. Pt states since then she has had this intense pain.   Pt states pain with movement.

## 2021-01-23 NOTE — H&P (Signed)
History and Physical  [place] Record LTJ:[id number] DOB: 11-23-1945 DOA: 01/23/2021  Referring physician: Dr. Jari Pigg, [place] PCP: Kirk Ruths, MD  Outpatient Specialists: PMR. Patient coming from: Home.  Chief Complaint: Severe lower back pain  HPI: Janice Tyler is a 75 y.o. female with medical history significant for chronic lower back pain, post mechanical heart valve placement on Coumadin, essential hypertension, hyperlipidemia, type 2 diabetes, CKD 3A, who presented to [facility] ED with complaints of worsening severe lower back pain.  Patient reports a week ago she had the right side of her lumbar spine nerve burned by her PMR.  The plan was to burn both sides but due to seizure like activity during the procedure it was aborted early.  She was sent to the ED.  Because of a subtherapeutic INR she remained hospitalized for 4 days until her INR improved to therapeutic level.  Discharged to home on 01/18/21.  For the past 5 days her back pain became worse to the point where she could not ambulate.  She attempted to use a cane and walker but the pain continued to worsen.  She denies any fevers chills or night sweats.  Nothing she did at home made her pain better.  She decided to return to the ED for further evaluation.  MRI lumbar spine revealed masslike structure potentially representing hematoma or abscess from L2-3 L5-S1.  Interventional radiology was consulted by EDP.  Plan for aspiration on 01/24/2021.  N.p.o. after midnight.  Coumadin on hold.  ED Course:  Afebrile, BP 155/64, pulse 76, respiratory rate 18, O2 saturation 98% on room air.  Lab studies were remarkable for serum sodium 132, serum glucose 215 AST 70 ALT 76, WBC 15 point 8K, hemoglobin 12.1.,  Admit to 220 K, INR 1.8.  Review of Systems: Review of systems as noted in the HPI. All other systems reviewed and are negative.   Past Medical History:  Diagnosis Date  . CHF (congestive heart failure) ([place])   . Chronic kidney disease    CKD   . COPD (chronic obstructive pulmonary disease) ([place])   . Dysrhythmia    Atrial Fibrillation  . GERD (gastroesophageal reflux disease)   . Hypertension   . Seizures ([place])    Past Surgical History:  Procedure Laterality Date  . ABDOMINAL HYSTERECTOMY    . AORTIC VALVE REPLACEMENT    . CARDIAC VALVE REPLACEMENT    . COLONOSCOPY WITH PROPOFOL N/A 06/18/2019   Procedure: COLONOSCOPY WITH PROPOFOL;  Surgeon: Toledo, Benay Pike, MD;  Location: [facility] ENDOSCOPY;  Service: Gastroenterology;  Laterality: N/A;  . OOPHORECTOMY    . REPLACEMENT TOTAL KNEE Bilateral   . TOTAL HIP ARTHROPLASTY Bilateral   . TUBAL LIGATION      Social History:  reports that she quit smoking about 7 years ago. She has a 42.00 pack-year smoking history. She has never used smokeless tobacco. She reports that she does not drink alcohol and does not use drugs.   No Known Allergies  Family History  Problem Relation Age of Onset  . Breast cancer Neg Hx       Prior to Admission medications   Medication Sig Start Date End Date Taking? Authorizing Provider  acetaminophen (TYLENOL) 500 MG tablet Take 500 mg by mouth every 6 (six) hours as needed.    [provider]  albuterol (VENTOLIN HFA) 108 (90 Base) MCG/ACT inhaler Inhale 1 puff into the lungs every 6 (six) hours as needed for wheezing or shortness of breath.    [provider]  amLODipine (NORVASC) 5 MG tablet Take 5 mg by mouth daily.    [provider]  aspirin EC 81 MG tablet Take 81 mg by mouth daily.    [provider]  atorvastatin (LIPITOR) 40 MG tablet Take 40 mg by mouth at bedtime.    [provider]  Cholecalciferol 50 MCG (2000 UT) TABS Take by mouth.    [provider]  Cyanocobalamin (VITAMIN B12) 1000 MCG TBCR     [provider]  enoxaparin (LOVENOX) 80 MG/0.8ML injection Inject 0.975 mLs (97.5 mg total) into the skin daily for 5 days. 01/18/21 01/23/21  Charlynne Cousins, MD   fluocinolone (SYNALAR) 0.01 % external solution Apply 1 application topically 2 (two) times daily. 06/11/20   [provider]  furosemide (LASIX) 40 MG tablet Take 40 mg by mouth daily.    [provider]  ketoconazole (NIZORAL) 2 % shampoo Apply 1 application topically 2 (two) times a week. 08/03/20   [provider]  lamoTRIgine (LAMICTAL) 150 MG tablet Take 150 mg by mouth 2 (two) times daily. 09/15/20   [provider]  metoprolol tartrate (LOPRESSOR) 50 MG tablet Take 50 mg by mouth 2 (two) times daily.    [provider]  pantoprazole (PROTONIX) 40 MG tablet Take 40 mg by mouth daily.    [provider]  TRELEGY ELLIPTA 100-62.5-25 MCG/INH AEPB Inhale 1 puff into the lungs daily. 12/18/20   [provider]  warfarin (COUMADIN) 1 MG tablet Take 1 mg by mouth daily. Take with 2 (3 mg) warfarin tablets to equal 7 mg    [provider]  warfarin (COUMADIN) 3 MG tablet Take 6 mg by mouth daily. Take with 1 mg to equal 7 mg    [provider]    Physical Exam: BP (!) 155/64   Pulse 72   Temp 98.7 F (37.1 C) (Oral)   Resp 18   Ht 5' 2.5" (1.588 m)   Wt 65.8 kg   SpO2 98%   BMI 26.10 kg/m   . General: 75 y.o. year-old female well developed well nourished in no acute distress.  Alert and oriented x3. . Cardiovascular: Regular rate and rhythm with no rubs or gallops.  No thyromegaly or JVD noted.  No lower extremity edema. 2/4 pulses in all 4 extremities. Marland Kitchen Respiratory: Clear to auscultation with no wheezes or rales. Good inspiratory effort. . Abdomen: Soft nontender nondistended with normal bowel sounds x4 quadrants. . Muskuloskeletal: No cyanosis, clubbing or edema noted bilaterally . Neuro: CN II-XII intact, strength, sensation, reflexes . Skin: No ulcerative lesions noted or rashes . Psychiatry: Judgement and insight appear normal. Mood is appropriate for condition and setting          Labs on Admission:   Basic Metabolic Panel: Recent Labs  Lab 01/23/21 1340  NA 132*  K 4.2  CL 101  CO2 19*  GLUCOSE 215*  BUN 23  CREATININE 0.99  CALCIUM 10.2   Liver Function Tests: Recent Labs  Lab 01/23/21 1340  AST 70*  ALT 76*  ALKPHOS 88  BILITOT 1.0  PROT 7.9  ALBUMIN 4.5   Recent Labs  Lab 01/23/21 1340  LIPASE 46   No results for input(s): AMMONIA in the last 168 hours. CBC: Recent Labs  Lab 01/17/21 0416 01/18/21 0436 01/23/21 1340  WBC 7.3 6.6 15.8*  HGB 11.1* 11.9* 12.1  HCT 35.7* 37.4 37.8  MCV 83.6 82.9 82.0  PLT 162 183  220   Cardiac Enzymes: No results for input(s): CKTOTAL, CKMB, CKMBINDEX, TROPONINI in the last 168 hours.  BNP (last 3 results) Recent Labs    01/15/21 1023  BNP 167.7*    ProBNP (last 3 results) No results for input(s): PROBNP in the last 8760 hours.  CBG: No results for input(s): GLUCAP in the last 168 hours.  Radiological Exams on Admission: MR LUMBAR SPINE WO CONTRAST  Result Date: 01/23/2021 CLINICAL DATA:  Low back pain and right abdominal pain EXAM: MRI LUMBAR SPINE WITHOUT CONTRAST TECHNIQUE: Multiplanar, multisequence MR imaging of the lumbar spine was performed. No intravenous contrast was administered. COMPARISON:  01/06/2018 MRI lumbar spine. FINDINGS: The patient was not able to tolerate the axial T1 weighted images and terminated the exam prior to their position. Despite efforts by the technologist and patient, motion artifact is present on today's exam and could not be eliminated. This reduces exam sensitivity and specificity. Segmentation: The lowest lumbar type non-rib-bearing vertebra is labeled as L5. Alignment: 3 mm degenerative anterolisthesis at L4-5, unchanged from 01/06/2018 Vertebrae: Disc desiccation at all levels between L2 and S1 but without substantial loss of disc height. No significant vertebral marrow edema is identified. Conus medullaris and cauda equina: Conus extends to the upper L2 level. Conus and cauda  equina appear normal. Paraspinal and other soft tissues: In the right paraspinal musculature (longissimus thoracis and iliocostalis lumborum muscles) extending from the L2 level through the L5-S1 level, we demonstrate a 4.1 by 3.9 by 13.0 cm (volume = 110 cm^3) serpentine masslike structure with high T2 and low T1 signal characteristics potentially representing hematoma or abscess. Disc levels: T10-11 and T11-12: Mild disc bulge without impingement. T12-L1: Mild disc bulge without impingement. L1-2: Unremarkable. L2-3: Unremarkable. L3-4: Borderline left foraminal stenosis due to disc bulge and left facet arthropathy. L4-5: Mild left foraminal stenosis due to disc uncovering, left foraminal disc protrusion, and facet arthropathy. L5-S1: Mild to moderate right and mild left foraminal stenosis due to disc bulge and facet arthropathy. There is also likely a right synovial cyst. IMPRESSION: 1. In the right paraspinal musculature (longissimus thoracis and iliocostalis lumborum) muscles extending from L2 through L5-S1, a 4.1 by 3.9 by 13.0 cm (volume = 110 cm^3) masslike structure with high T2 and low T1 signal characteristics potentially representing hematoma or abscess. 2. Lumbar spondylosis and degenerative disc disease, causing mild to moderate impingement at L5-S1 and mild impingement at L4-5. Electronically Signed   By: Van Clines M.D.   On: 01/23/2021 19:05   DG Hip Unilat W or Wo Pelvis 2-3 Views Right  Result Date: 01/23/2021 CLINICAL DATA:  Right side abdominal pain radiating to the legs and back. EXAM: DG HIP (WITH OR WITHOUT PELVIS) 2-3V RIGHT COMPARISON:  None. FINDINGS: There is no acute bony or joint abnormality. 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 Soft tissues are negative. IMPRESSION: No acute abnormality. Electronically Signed   By: Inge Rise M.D.   On: 01/23/2021 17:54    EKG: I independently viewed the EKG done and my findings are as followed: None available at the time of  this visit.  Assessment/Plan Present on Admission: . Back pain  Active Problems:   Back pain  Severe back pain likely secondary to masslike structure potentially representing hematoma or abscess from L2-3 L5-S1.   Interventional radiology was consulted by EDP.   Plan for aspiration on 01/24/2021.   N.p.o. after midnight. Coumadin on hold Antibiotics not started until aspiration sample is obtained Obtain blood cx x2 peripherally,  follow results Pain control. PT/OT to assess once hemodynamically stable. Fall precautions.  Post mechanical valve placement Goal INR between 2.5 and 3.5 INR is subtherapeutic 1.8. Hold off Coumadin due to planned procedure on 01/24/2021 Restart Coumadin as soon as possible, when okay with IR. May need to be bridged with subcu Lovenox to achieve therapeutic level, pharmacy consulted.  Subtherapeutic INR Management as noted above. Repeat INR in the morning.  Essential hypertension BP is not at goal Resume home oral antihypertensives. Monitor vital signs.  GERD/hyperlipidemia Resume home regimen.  Seizure disorder Resume home regimen Seizure precautions.    DVT prophylaxis: SCDs.  Coumadin on hold due to planned procedure by IR on 01/24/2021.  Code Status: Full code as stated by the patient herself.  Family Communication: None at bedside.  Disposition Plan: Admit to MedSurg unit with remote telemetry.  Consults called: Interventional radiology consulted by EDP.  Admission status: Inpatient status.  Patient will require at least 2 midnights for further evaluation and treatment of present condition.   Status is: Inpatient   Dispo:  Patient From: Home  Planned Disposition: Home  Anticipated discharge date 01/25/2021.  Medically stable for discharge: No, ongoing management of severe back pain.         Kayleen Memos MD [facility] Pager [phone]  If 7PM-7AM, please contact night-coverage [email or web address] Password  [facility]  01/23/2021, 9:37 PM

## 2021-01-23 NOTE — Progress Notes (Signed)
ANTICOAGULATION CONSULT NOTE - Initial Consult  Pharmacy Consult for Warfarin continuation Indication: Afib  No Known Allergies  Patient Measurements: Height: 5' 2.5" (158.8 cm) Weight: 65.8 kg (145 lb) IBW/kg (Calculated) : 51.25 Heparin Dosing Weight:    Vital Signs: Temp: 98.7 F (37.1 C) (03/18 1343) Temp Source: Oral (03/18 1343) BP: 155/64 (03/18 2100) Pulse Rate: 72 (03/18 2000)  Labs: Recent Labs    01/23/21 1340 01/23/21 1805  HGB 12.1  --   HCT 37.8  --   PLT 220  --   APTT  --  38*  LABPROT  --  20.4*  INR  --  1.8*  CREATININE 0.99  --     Estimated Creatinine Clearance: 43.6 mL/min (by C-G formula based on SCr of 0.99 mg/dL).   Medical History: Past Medical History:  Diagnosis Date  . CHF (congestive heart failure) ([place])   . Chronic kidney disease    CKD  . COPD (chronic obstructive pulmonary disease) ([place])   . Dysrhythmia    Atrial Fibrillation  . GERD (gastroesophageal reflux disease)   . Hypertension   . Seizures ([place])     Assessment: Patient is 75yo female being admitted with leg/buttock pain and inability to ambulate. Pharmacy consulted to resume Coumadin when okay with IR. Patient is tentatively planned for IR procedure on Saturday.  INR on admission 1.8 Home dose Coumadin 6.5 mg daily  Goal of Therapy:  INR 2-3  Plan:  Will hold warfarin for now and follow up on plans for IR procedure. Daily INR checks.  Paulina Fusi, PharmD, BCPS 01/23/2021 10:13 PM

## 2021-01-24 ENCOUNTER — Encounter: Payer: Self-pay | Admitting: Internal Medicine

## 2021-01-24 ENCOUNTER — Inpatient Hospital Stay: Payer: Medicare Other

## 2021-01-24 DIAGNOSIS — R222 Localized swelling, mass and lump, trunk: Secondary | ICD-10-CM

## 2021-01-24 DIAGNOSIS — I10 Essential (primary) hypertension: Secondary | ICD-10-CM

## 2021-01-24 DIAGNOSIS — E785 Hyperlipidemia, unspecified: Secondary | ICD-10-CM

## 2021-01-24 DIAGNOSIS — Z952 Presence of prosthetic heart valve: Secondary | ICD-10-CM

## 2021-01-24 DIAGNOSIS — K219 Gastro-esophageal reflux disease without esophagitis: Secondary | ICD-10-CM

## 2021-01-24 DIAGNOSIS — M545 Low back pain, unspecified: Secondary | ICD-10-CM

## 2021-01-24 LAB — COMPREHENSIVE METABOLIC PANEL
ALT: 66 U/L — ABNORMAL HIGH (ref 0–44)
AST: 49 U/L — ABNORMAL HIGH (ref 15–41)
Albumin: 4.1 g/dL (ref 3.5–5.0)
Alkaline Phosphatase: 74 U/L (ref 38–126)
Anion gap: 8 (ref 5–15)
BUN: 24 mg/dL — ABNORMAL HIGH (ref 8–23)
CO2: 25 mmol/L (ref 22–32)
Calcium: 9.8 mg/dL (ref 8.9–10.3)
Chloride: 102 mmol/L (ref 98–111)
Creatinine, Ser: 0.88 mg/dL (ref 0.44–1.00)
GFR, Estimated: >60 mL/min (ref >60)
Glucose, Bld: 121 mg/dL — ABNORMAL HIGH (ref 70–99)
Potassium: 4.4 mmol/L (ref 3.5–5.1)
Sodium: 135 mmol/L (ref 135–145)
Total Bilirubin: 1.4 mg/dL — ABNORMAL HIGH (ref 0.3–1.2)
Total Protein: 7.4 g/dL (ref 6.5–8.1)

## 2021-01-24 LAB — PROTIME-INR
INR: 1.6 — ABNORMAL HIGH (ref 0.8–1.2)
Prothrombin Time: 18.4 seconds — ABNORMAL HIGH (ref 11.4–15.2)

## 2021-01-24 LAB — URINALYSIS, COMPLETE (UACMP) WITH MICROSCOPIC
Bilirubin Urine: NEGATIVE
Glucose, UA: NEGATIVE mg/dL
Ketones, ur: NEGATIVE mg/dL
Leukocytes,Ua: NEGATIVE
Nitrite: NEGATIVE
Protein, ur: >=300 mg/dL — AB
Specific Gravity, Urine: 1.013 (ref 1.005–1.030)
pH: 6 (ref 5.0–8.0)

## 2021-01-24 LAB — CBC WITH DIFFERENTIAL/PLATELET
Abs Immature Granulocytes: 0.13 10*3/uL — ABNORMAL HIGH (ref 0.00–0.07)
Basophils Absolute: 0 10*3/uL (ref 0.0–0.1)
Basophils Relative: 0 %
Eosinophils Absolute: 0 10*3/uL (ref 0.0–0.5)
Eosinophils Relative: 0 %
HCT: 36.8 % (ref 36.0–46.0)
Hemoglobin: 11.8 g/dL — ABNORMAL LOW (ref 12.0–15.0)
Immature Granulocytes: 1 %
Lymphocytes Relative: 8 %
Lymphs Abs: 1 10*3/uL (ref 0.7–4.0)
MCH: 26.2 pg (ref 26.0–34.0)
MCHC: 32.1 g/dL (ref 30.0–36.0)
MCV: 81.6 fL (ref 80.0–100.0)
Monocytes Absolute: 0.9 10*3/uL (ref 0.1–1.0)
Monocytes Relative: 8 %
Neutro Abs: 9.7 10*3/uL — ABNORMAL HIGH (ref 1.7–7.7)
Neutrophils Relative %: 83 %
Platelets: 197 10*3/uL (ref 150–400)
RBC: 4.51 MIL/uL (ref 3.87–5.11)
RDW: 14.1 % (ref 11.5–15.5)
WBC: 11.7 10*3/uL — ABNORMAL HIGH (ref 4.0–10.5)
nRBC: 0 % (ref 0.0–0.2)

## 2021-01-24 LAB — PROCALCITONIN: Procalcitonin: <0.1 ng/mL

## 2021-01-24 LAB — GLUCOSE, CAPILLARY
Glucose-Capillary: 124 mg/dL — ABNORMAL HIGH (ref 70–99)
Glucose-Capillary: 121 mg/dL — ABNORMAL HIGH (ref 70–99)
Glucose-Capillary: 136 mg/dL — ABNORMAL HIGH (ref 70–99)
Glucose-Capillary: 145 mg/dL — ABNORMAL HIGH (ref 70–99)

## 2021-01-24 LAB — HEMOGLOBIN A1C
Hgb A1c MFr Bld: 6.5 % — ABNORMAL HIGH (ref 4.8–5.6)
Mean Plasma Glucose: 139.85 mg/dL

## 2021-01-24 LAB — CBG MONITORING, ED: Glucose-Capillary: 122 mg/dL — ABNORMAL HIGH (ref 70–99)

## 2021-01-24 LAB — MRSA PCR SCREENING: MRSA by PCR: NEGATIVE

## 2021-01-24 MED ORDER — MIDAZOLAM HCL 2 MG/2ML IJ SOLN
INTRAMUSCULAR | Status: AC | PRN
Start: 2021-01-24 — End: 2021-01-24
  Administered 2021-01-24: 1 mg via INTRAVENOUS

## 2021-01-24 MED ORDER — IBUPROFEN 400 MG PO TABS: 400.0000 mg | ORAL_TABLET | Freq: Four times a day (QID) | ORAL | Status: DC | PRN

## 2021-01-24 MED ORDER — HYDROCODONE-ACETAMINOPHEN 7.5-325 MG PO TABS
1.0000 | ORAL_TABLET | Freq: Four times a day (QID) | ORAL | Status: DC | PRN
Start: 2021-01-24 — End: 2021-01-25
  Administered 2021-01-24 – 2021-01-25 (×3): 1 via ORAL
  Filled 2021-01-24 (×3): qty 1

## 2021-01-24 MED ORDER — WARFARIN - PHARMACIST DOSING INPATIENT: Freq: Every day | Status: DC

## 2021-01-24 MED ORDER — VANCOMYCIN HCL 1500 MG/300ML IV SOLN
1500.0000 mg | Freq: Once | INTRAVENOUS | Status: AC
  Administered 2021-01-24: 1500 mg via INTRAVENOUS
  Filled 2021-01-24: qty 300

## 2021-01-24 MED ORDER — MIDAZOLAM HCL 5 MG/5ML IJ SOLN
INTRAMUSCULAR | Status: AC
  Filled 2021-01-24: qty 5

## 2021-01-24 MED ORDER — OXYCODONE HCL 5 MG PO TABS
5.0000 mg | ORAL_TABLET | ORAL | Status: DC | PRN
  Administered 2021-01-24: 5 mg via ORAL
  Filled 2021-01-24 (×2): qty 1

## 2021-01-24 MED ORDER — HYDRALAZINE HCL 20 MG/ML IJ SOLN: 5.0000 mg | Freq: Four times a day (QID) | INTRAMUSCULAR | Status: DC | PRN

## 2021-01-24 MED ORDER — IBUPROFEN 400 MG PO TABS
400.0000 mg | ORAL_TABLET | Freq: Once | ORAL | Status: AC
  Administered 2021-01-25: 400 mg via ORAL
  Filled 2021-01-24: qty 1

## 2021-01-24 MED ORDER — HYDROMORPHONE HCL 1 MG/ML IJ SOLN
0.5000 mg | INTRAMUSCULAR | Status: DC | PRN
  Administered 2021-01-24 – 2021-01-25 (×2): 0.5 mg via INTRAVENOUS
  Filled 2021-01-24 (×2): qty 1

## 2021-01-24 MED ORDER — SENNOSIDES-DOCUSATE SODIUM 8.6-50 MG PO TABS
1.0000 | ORAL_TABLET | Freq: Every day | ORAL | Status: DC
  Administered 2021-01-24 (×2): 1 via ORAL
  Filled 2021-01-24 (×3): qty 1

## 2021-01-24 MED ORDER — FENTANYL CITRATE (PF) 100 MCG/2ML IJ SOLN
INTRAMUSCULAR | Status: AC | PRN
  Administered 2021-01-24: 50 ug via INTRAVENOUS

## 2021-01-24 MED ORDER — SODIUM CHLORIDE 0.9 % IV SOLN
3.0000 g | Freq: Four times a day (QID) | INTRAVENOUS | Status: DC
  Administered 2021-01-24 – 2021-01-26 (×8): 3 g via INTRAVENOUS
  Filled 2021-01-24 (×10): qty 8

## 2021-01-24 MED ORDER — INSULIN ASPART 100 UNIT/ML ~~LOC~~ SOLN
0.0000 [IU] | SUBCUTANEOUS | Status: DC
  Filled 2021-01-24: qty 1

## 2021-01-24 MED ORDER — WARFARIN SODIUM 6 MG PO TABS
7.0000 mg | ORAL_TABLET | Freq: Once | ORAL | Status: AC
  Administered 2021-01-24: 7 mg via ORAL
  Filled 2021-01-24: qty 1

## 2021-01-24 MED ORDER — FENTANYL CITRATE (PF) 100 MCG/2ML IJ SOLN
INTRAMUSCULAR | Status: AC
  Filled 2021-01-24: qty 2

## 2021-01-24 MED ORDER — PNEUMOCOCCAL VAC POLYVALENT 25 MCG/0.5ML IJ INJ
0.5000 mL | INJECTION | INTRAMUSCULAR | Status: AC
  Filled 2021-01-24: qty 0.5

## 2021-01-24 MED ORDER — VANCOMYCIN HCL 1000 MG/200ML IV SOLN
1000.0000 mg | INTRAVENOUS | Status: DC
  Filled 2021-01-24: qty 200

## 2021-01-24 NOTE — Progress Notes (Signed)
CSW confirmed with [facility] Representative Corene Cornea, patient is currently active with [facility] and OT services.  Janice Tyler, [place]

## 2021-01-24 NOTE — Progress Notes (Addendum)
OT Cancellation Note  Patient Details Name: Monique Hefty MRN: [id number] DOB: December 05, 1945   Cancelled Treatment:    Reason Eval/Treat Not Completed: Patient at procedure or test/ unavailable (Pt. is out of her room. Will attempt the initial eval at a later time, or date.)  Harrel Carina, MS, OTR/L 01/24/2021, 9:58 AM

## 2021-01-24 NOTE — Plan of Care (Signed)

## 2021-01-24 NOTE — Evaluation (Addendum)
Occupational Therapy Evaluation Patient Details Name: Janice Tyler MRN: [id number] DOB: [birth year] Today's Date: 01/24/2021    History of Present Illness Pt. is a 75 y/o female with increased LBP was admitted to [facility] following a Lumbar Radioblation procedure on 01/15/2021. Pt. was then discharged home on 01/23/21. Pt. has now been readmitted due to continued severe low back pain. 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   Clinical Impression   Pt. Presents with 5-6/10low back pain at rest radiating from her back to her knees, weakness, limited activity tolerance, and limited functional mobility which hinders her ability to complete basic ADL and IADL functioning. Upon arrival pt. Presented in sidelying, and reported increased pain moving from this position. Pt. reports having had a procedure to drain the lumbar hematoma this morning, and has had increased pain since. Pt. Is limited by pain. Pt. education was provided about A/E use for LE ADLs, and positioning. Pt. Could benefit from OT services for ADL training, A/E training, and pt. Education about home modification, and DME. Pt. would benefit from SNF level of care upon discharge, with follow-up OT services.    Follow Up Recommendations  Home health OT    Equipment Recommendations  Tub/shower seat    Recommendations for Other Services       Precautions / Restrictions Precautions Precautions: Fall Restrictions Weight Bearing Restrictions: No      Mobility Bed Mobility Overal bed mobility: Needs Assistance Bed Mobility: Supine to Sit;Sit to Supine     Supine to sit: Min assist     General bed mobility comments:     Transfers Overall transfer level: Needs assistance   Transfers: Sit to/from Stand Sit to Stand: Min assist         General transfer comment: Mobility was deferred secondary to increased pain reports.    Balance                             ADL either performed or assessed with clinical judgement   ADL Overall ADL's : Needs assistance/impaired Eating/Feeding: Set up   Grooming: Minimal assistance   Upper Body Bathing: Minimal assistance   Lower Body Bathing: Maximal assistance   Upper Body Dressing : Minimal assistance   Lower Body Dressing: Maximal assistance   Toilet Transfer: Maximal assistance Toilet Transfer Details (indicate cue type and reason): secondary to pain                 Vision Baseline Vision/History: Wears glasses Wears Glasses: At all times Patient Visual Report: No change from baseline       Perception     Praxis      Pertinent Vitals/Pain Pain Assessment: 0-10 Pain Score: 6  Faces Pain Scale: Hurts even more Pain Location: Lumbar region of back and down left side Pain Descriptors / Indicators: Grimacing;Aching;Guarding;Constant Pain Intervention(s): Monitored during session;Limited activity within patient's tolerance     Hand Dominance Right   Extremity/Trunk Assessment Upper Extremity Assessment Upper Extremity Assessment: Overall WFL for tasks assessed         Communication Communication Communication: No difficulties   Cognition Arousal/Alertness: Awake/alert Behavior During Therapy: WFL for tasks assessed/performed Overall Cognitive Status: Within Functional Limits for tasks assessed  General Comments: Pt willing to participate in interventions, but limited due to severity of LBP   General Comments       Exercises     Shoulder Instructions      Home Living Family/patient expects to be discharged to:: Private residence Living Arrangements: Spouse/significant other Available Help at Discharge: Family;Available 24 hours/day Type of Home: House Home Access: Level entry     Home Layout: Two level;Able to live on main level with bedroom/bathroom Alternate Level Stairs-Number  of Steps: bonus room upstairs; no essential needs in space (she "never goes up there")   Bathroom Shower/Tub: Occupational psychologist: Standard     Home Equipment: Cane - single point;Walker - 2 wheels;Grab bars - toilet;Grab bars - tub/shower          Prior Functioning/Environment Level of Independence: Independent with assistive device(s)        Comments: Independent without assist device for ADLs, household mobility; [place] for longer, community distances (due to chronic L hip pain).        OT Problem List: Decreased strength;Decreased range of motion;Decreased activity tolerance;Impaired balance (sitting and/or standing)      OT Treatment/Interventions: Self-care/ADL training;Therapeutic exercise;Patient/family education;Balance training;Energy conservation;Therapeutic activities    OT Goals(Current goals can be found in the care plan section) Acute Rehab OT Goals Patient Stated Goal: To have decreased pain OT Goal Formulation: With patient Time For Goal Achievement: 01/30/21 Potential to Achieve Goals: Good  OT Frequency: Min 1X/week   Barriers to D/C:            Co-evaluation              AM-PAC OT "6 Clicks" Daily Activity     Outcome Measure Help from another person eating meals?: None Help from another person taking care of personal grooming?: A Little Help from another person toileting, which includes using toliet, bedpan, or urinal?: A Lot Help from another person bathing (including washing, rinsing, drying)?: A Lot Help from another person to put on and taking off regular upper body clothing?: A Little Help from another person to put on and taking off regular lower body clothing?: A Lot 6 Click Score: 16   End of Session Equipment Utilized During Treatment: Gait belt  Activity Tolerance: Patient tolerated treatment well;Other (comment) Patient left: in bed;with call bell/phone within reach;with bed alarm set  OT Visit Diagnosis:  Unsteadiness on feet (R26.81);Muscle weakness (generalized) (M62.81);Pain                Time: 1356-1420 OT Time Calculation (min): 24 min Charges:  OT General Charges $OT Visit: 1 Visit OT Evaluation $OT Eval Low Complexity: 1 Low  Harrel Carina, MS, OTR/L   Harrel Carina 01/24/2021, 4:19 PM

## 2021-01-24 NOTE — Progress Notes (Signed)
Patient ID: Janice Tyler, female   DOB: 01/02/1946, 75 y.o.   MRN: [id number] [facility] PROGRESS NOTE  Janice Tyler SJG:[id number] DOB: [birth year] DOA: 01/23/2021 PCP: Kirk Ruths, MD  HPI/Subjective: Patient has been getting nerve blocks by Dr. Sharlet Salina as outpatient.  They had a stop procedure.  Patient developing severe back pain unable to put weight down on her legs and very severe.  Patient has bruising on back bilaterally.  Patient patient on Coumadin for metallic valve.  Objective: Vitals:   01/24/21 1101 01/24/21 1302  BP: (!) 150/58   Pulse: (!) 53 (!) 55  Resp: 17   Temp: 98.3 F (36.8 C)   SpO2: 96%    No intake or output data in the 24 hours ending 01/24/21 1324 Filed Weights   01/23/21 1342 01/24/21 0257  Weight: 65.8 kg 64 kg    ROS: Review of Systems  Respiratory: Negative for cough and shortness of breath.   Cardiovascular: Negative for chest pain.  Gastrointestinal: Negative for abdominal pain, nausea and vomiting.  Musculoskeletal: Positive for back pain.   Exam: Physical Exam HENT:     Head: Normocephalic.  Eyes:     General: Lids are normal.     Conjunctiva/sclera: Conjunctivae normal.  Cardiovascular:     Rate and Rhythm: Normal rate and regular rhythm.     Heart sounds: Normal heart sounds, S1 normal and S2 normal.  Pulmonary:     Breath sounds: Normal breath sounds. No decreased breath sounds, wheezing, rhonchi or rales.  Abdominal:     Palpations: Abdomen is soft.     Tenderness: There is no abdominal tenderness.  Musculoskeletal:     Right ankle: No swelling.     Left ankle: No swelling.  Skin:    General: Skin is warm.     Findings: No rash.     Comments: Bruising bilateral lower back  Neurological:     Mental Status: She is alert and oriented to person, place, and time.     Comments: Able to flex and extend at the ankles bilaterally.  Able to bend at the knees.  Did not want to lie on her back secondary to pain.        Data Reviewed: Basic Metabolic Panel: Recent Labs  Lab 01/23/21 1340 01/24/21 0521  NA 132* 135  K 4.2 4.4  CL 101 102  CO2 19* 25  GLUCOSE 215* 121*  BUN 23 24*  CREATININE 0.99 0.88  CALCIUM 10.2 9.8   Liver Function Tests: Recent Labs  Lab 01/23/21 1340 01/24/21 0521  AST 70* 49*  ALT 76* 66*  ALKPHOS 88 74  BILITOT 1.0 1.4*  PROT 7.9 7.4  ALBUMIN 4.5 4.1   Recent Labs  Lab 01/23/21 1340  LIPASE 46   CBC: Recent Labs  Lab 01/18/21 0436 01/23/21 1340 01/24/21 0521  WBC 6.6 15.8* 11.7*  NEUTROABS  --   --  9.7*  HGB 11.9* 12.1 11.8*  HCT 37.4 37.8 36.8  MCV 82.9 82.0 81.6  PLT 183 220 197   BNP (last 3 results) Recent Labs    01/15/21 1023  BNP 167.7*    CBG: Recent Labs  Lab 01/24/21 0132 01/24/21 0416 01/24/21 0754 01/24/21 1203  GLUCAP 122* 124* 121* 136*    Recent Results (from the past 240 hour(s))  SARS CORONAVIRUS 2 (TAT 6-24 HRS) Nasopharyngeal Nasopharyngeal Swab     Status: None   Collection Time: 01/15/21  2:56 PM   Specimen: Nasopharyngeal Swab  Result  Value Ref Range Status   SARS Coronavirus 2 NEGATIVE NEGATIVE Final    Comment: (NOTE) SARS-CoV-2 target nucleic acids are NOT DETECTED.  The SARS-CoV-2 RNA is generally detectable in upper and lower respiratory specimens during the acute phase of infection. Negative results do not preclude SARS-CoV-2 infection, do not rule out co-infections with other pathogens, and should not be used as the sole basis for treatment or other patient management decisions. Negative results must be combined with clinical observations, patient history, and epidemiological information. The expected result is Negative.  Fact Sheet for Patients: SugarRoll.be  Fact Sheet for Healthcare Providers: https://www.woods-mathews.com/  This test is not yet approved or cleared by the [place] FDA and  has been authorized for detection and/or  diagnosis of SARS-CoV-2 by FDA under an Emergency Use Authorization (EUA). This EUA will remain  in effect (meaning this test can be used) for the duration of the COVID-19 declaration under Se ction 564(b)(1) of the Act, 21 U.S.C. section 360bbb-3(b)(1), unless the authorization is terminated or revoked sooner.  Performed at [facility], [place] [address] [place], [place] [address]   CULTURE, BLOOD (ROUTINE X 2) w Reflex to ID Panel     Status: None (Preliminary result)   Collection Time: 01/23/21 12:04 AM   Specimen: BLOOD  Result Value Ref Range Status   Specimen Description BLOOD  L HAND  Final   Special Requests   Final    BOTTLES DRAWN AEROBIC AND ANAEROBIC Blood Culture adequate volume   Culture   Final    NO GROWTH < 12 HOURS Performed at [facility], [address], [place], [place] [address]    Report Status PENDING  Incomplete  Resp Panel by RT-PCR (Flu A&B, Covid) Nasopharyngeal Swab     Status: None   Collection Time: 01/23/21  4:38 PM   Specimen: Nasopharyngeal Swab; Nasopharyngeal(NP) swabs in vial transport medium  Result Value Ref Range Status   SARS Coronavirus 2 by RT PCR NEGATIVE NEGATIVE Final    Comment: (NOTE) SARS-CoV-2 target nucleic acids are NOT DETECTED.  The SARS-CoV-2 RNA is generally detectable in upper respiratory specimens during the acute phase of infection. The lowest concentration of SARS-CoV-2 viral copies this assay can detect is 138 copies/mL. A negative result does not preclude SARS-Cov-2 infection and should not be used as the sole basis for treatment or other patient management decisions. A negative result may occur with  improper specimen collection/handling, submission of specimen other than nasopharyngeal swab, presence of viral mutation(s) within the areas targeted by this assay, and inadequate number of viral copies(<138 copies/mL). A negative result must be combined with clinical observations, patient history, and  epidemiological information. The expected result is Negative.  Fact Sheet for Patients:  EntrepreneurPulse.com.au  Fact Sheet for Healthcare Providers:  IncredibleEmployment.be  This test is no t yet approved or cleared by the [place] FDA and  has been authorized for detection and/or diagnosis of SARS-CoV-2 by FDA under an Emergency Use Authorization (EUA). This EUA will remain  in effect (meaning this test can be used) for the duration of the COVID-19 declaration under Section 564(b)(1) of the Act, 21 U.S.C.section 360bbb-3(b)(1), unless the authorization is terminated  or revoked sooner.       Influenza A by PCR NEGATIVE NEGATIVE Final   Influenza B by PCR NEGATIVE NEGATIVE Final    Comment: (NOTE) The Xpert Xpress SARS-CoV-2/FLU/RSV plus assay is intended as an aid in the diagnosis of influenza from Nasopharyngeal swab specimens  and should not be used as a sole basis for treatment. Nasal washings and aspirates are unacceptable for Xpert Xpress SARS-CoV-2/FLU/RSV testing.  Fact Sheet for Patients: EntrepreneurPulse.com.au  Fact Sheet for Healthcare Providers: IncredibleEmployment.be  This test is not yet approved or cleared by the [place] FDA and has been authorized for detection and/or diagnosis of SARS-CoV-2 by FDA under an Emergency Use Authorization (EUA). This EUA will remain in effect (meaning this test can be used) for the duration of the COVID-19 declaration under Section 564(b)(1) of the Act, 21 U.S.C. section 360bbb-3(b)(1), unless the authorization is terminated or revoked.  Performed at [facility], [address] [place], [place] [address]   MRSA PCR Screening     Status: None   Collection Time: 01/24/21 12:04 AM   Specimen: Nasopharyngeal  Result Value Ref Range Status   MRSA by PCR NEGATIVE NEGATIVE Final    Comment:        The GeneXpert MRSA Assay  (FDA approved for NASAL specimens only), is one component of a comprehensive MRSA colonization surveillance program. It is not intended to diagnose MRSA infection nor to guide or monitor treatment for MRSA infections. Performed at [facility], [address] [place], [place] [address]   CULTURE, BLOOD (ROUTINE X 2) w Reflex to ID Panel     Status: None (Preliminary result)   Collection Time: 01/24/21 12:09 AM   Specimen: BLOOD  Result Value Ref Range Status   Specimen Description BLOOD  L ARM  Final   Special Requests   Final    BOTTLES DRAWN AEROBIC AND ANAEROBIC Blood Culture adequate volume   Culture   Final    NO GROWTH < 12 HOURS Performed at [facility] [address] [place], [place] [address]    Report Status PENDING  Incomplete     Studies: MR LUMBAR SPINE WO CONTRAST  Result Date: 01/23/2021 CLINICAL DATA:  Low back pain and right abdominal pain EXAM: MRI LUMBAR SPINE WITHOUT CONTRAST TECHNIQUE: Multiplanar, multisequence MR imaging of the lumbar spine was performed. No intravenous contrast was administered. COMPARISON:  01/06/2018 MRI lumbar spine. FINDINGS: The patient was not able to tolerate the axial T1 weighted images and terminated the exam prior to their position. Despite efforts by the technologist and patient, motion artifact is present on today's exam and could not be eliminated. This reduces exam sensitivity and specificity. Segmentation: The lowest lumbar type non-rib-bearing vertebra is labeled as L5. Alignment: 3 mm degenerative anterolisthesis at L4-5, unchanged from 01/06/2018 Vertebrae: Disc desiccation at all levels between L2 and S1 but without substantial loss of disc height. No significant vertebral marrow edema is identified. Conus medullaris and cauda equina: Conus extends to the upper L2 level. Conus and cauda equina appear normal. Paraspinal and other soft tissues: In the right paraspinal musculature (longissimus thoracis and  iliocostalis lumborum muscles) extending from the L2 level through the L5-S1 level, we demonstrate a 4.1 by 3.9 by 13.0 cm (volume = 110 cm^3) serpentine masslike structure with high T2 and low T1 signal characteristics potentially representing hematoma or abscess. Disc levels: T10-11 and T11-12: Mild disc bulge without impingement. T12-L1: Mild disc bulge without impingement. L1-2: Unremarkable. L2-3: Unremarkable. L3-4: Borderline left foraminal stenosis due to disc bulge and left facet arthropathy. L4-5: Mild left foraminal stenosis due to disc uncovering, left foraminal disc protrusion, and facet arthropathy. L5-S1: Mild to moderate right and mild left foraminal stenosis due to disc bulge and facet arthropathy. There is also likely a  right synovial cyst. IMPRESSION: 1. In the right paraspinal musculature (longissimus thoracis and iliocostalis lumborum) muscles extending from L2 through L5-S1, a 4.1 by 3.9 by 13.0 cm (volume = 110 cm^3) masslike structure with high T2 and low T1 signal characteristics potentially representing hematoma or abscess. 2. Lumbar spondylosis and degenerative disc disease, causing mild to moderate impingement at L5-S1 and mild impingement at L4-5. Electronically Signed   By: Van Clines M.D.   On: 01/23/2021 19:05   DG Hip Unilat W or Wo Pelvis 2-3 Views Right  Result Date: 01/23/2021 CLINICAL DATA:  Right side abdominal pain radiating to the legs and back. EXAM: DG HIP (WITH OR WITHOUT PELVIS) 2-3V RIGHT COMPARISON:  None. FINDINGS: There is no acute bony or joint abnormality. 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 Soft tissues are negative. IMPRESSION: No acute abnormality. Electronically Signed   By: Inge Rise M.D.   On: 01/23/2021 17:54    Scheduled Meds: . amLODipine  5 mg Oral Daily  . atorvastatin  40 mg Oral QHS  . fentaNYL      . fluticasone furoate-vilanterol  1 puff Inhalation Daily   And  . umeclidinium bromide  1 puff Inhalation Daily  .  insulin aspart  0-9 Units Subcutaneous Q4H  . lamoTRIgine  150 mg Oral BID  . metoprolol tartrate  50 mg Oral BID  . midazolam      . pantoprazole  40 mg Oral Daily  . [START ON 01/25/2021] pneumococcal 23 valent vaccine  0.5 mL Intramuscular Tomorrow-1000  . senna-docusate  1 tablet Oral QHS  . warfarin  7 mg Oral ONCE-1600  . Warfarin - Pharmacist Dosing Inpatient   Does not apply q1600   Continuous Infusions: . ampicillin-sulbactam (UNASYN) IV      Assessment/Plan:  1. Severe back pain.  Either hematoma or abscess paraspinally on the right.  MRI reviewed.  Patient has bruising on her back.  Interventional radiology took off 5 mL and sent to the lab for culture.  Will start antibiotics Unasyn and vancomycin.  Physical therapy evaluation. 2. Mechanical heart valve.  Spoke with pharmacy to restart Coumadin tonight.  INR 1.6 today. 3. Essential hypertension on metoprolol and Norvasc 4. GERD on PPI 5. Seizure disorder continue usual medications 6. Hyperlipidemia unspecified continue atorvastatin 7. COPD on Umeclidinium bromide inhaler        Code Status:     Code Status Orders  (From admission, onward)         Start     Ordered   01/24/21 0046  Full code  Continuous        01/24/21 0045        Code Status History    Date Active Date Inactive Code Status Order ID Comments User Context   01/15/2021 1508 01/18/2021 1934 Full Code [id number]  Ivor Costa, MD ED   Advance Care Planning Activity     Family Communication: Spoke with husband on the phone Disposition Plan: Status is: Inpatient  Dispo:  Patient From: Home  Planned Disposition: Home with Health Care Svc  Medically stable for discharge: No   Consultants:  Interventional radiology  Antibiotics:  Start vancomycin and Unasyn  Time spent: 28 minutes  [place]

## 2021-01-24 NOTE — Evaluation (Signed)
Physical Therapy Evaluation Patient Details Name: Janice Tyler MRN: [id number] DOB: 05-23-1946 Today's Date: 01/24/2021   History of Present Illness  Janice Tyler is a 75 y/o female who started having increased LBP following a lumbar radioablation procedure on 01/15/21.  She was admitted to [facility] following the procedure on 01/15/21 to be screened for a stroke which was ruled out and she was discharged home on 01/23/21 and returned due to continued severe low back pain. 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  Clinical Impression  Pt received supine in bed in the sidelying position with her husband seated next to her. Pt reports she just returned from a procedure to get the fluid off her back. She stated that they gave her some pain medication while she was in the procedure room and her pain is only a 6/10 when lying down. She also reports that the pain medication they had been giving her had not been helping with her pain at all, in fact she feels like it made her pain worse. Pt hesitant to participate in PT interventions today due to severe LBP, but agreed to try and walk to the chair to sit up for a little while. Pt required increased time, bed rails to complete supine<>sit transfer due to increased pain. Pt required elevated bed to complete sit<>stand transfer with minA for RW management and to maintain the upright position safely without LOB. Pt with reports of severe increase in LBP requested to get back into the bed without ambulating. Pt transferred to sitting EOB with minA to complete sit<>supine transfer. Pt reports increase in back pain and inability to tolerate ambulating and sitting upright in the chair at this time. Pt left supine in bed with husband at bedside with all needs met, call bell in reach. Discussed PT eval finding with nurse.       Follow Up Recommendations SNF    Equipment  Recommendations       Recommendations for Other Services       Precautions / Restrictions Precautions Precautions: Fall Restrictions Weight Bearing Restrictions: No      Mobility  Bed Mobility Overal bed mobility: Needs Assistance Bed Mobility: Supine to Sit;Sit to Supine     Supine to sit: Min assist     General bed mobility comments: Pt with increased LBP required use of handrails and elevated HOB to complete transfer Patient Response: Anxious  Transfers Overall transfer level: Needs assistance   Transfers: Sit to/from Stand Sit to Stand: Min assist         General transfer comment: Pt with severe increase in LBP, unable to stand in the full upright position, but able to stay standing for 30 seconds with BIL UE on RW before needing to sit down  Ambulation/Gait     Assistive device: Standard walker       General Gait Details: Pt refused ambulation today due to increased LBP and requesting to stay in her bed  Stairs            Wheelchair Mobility    Modified Rankin (Stroke Patients Only)       Balance Overall balance assessment: Needs assistance Sitting-balance support: Bilateral upper extremity supported;Feet supported Sitting balance-Leahy Scale: Good Sitting balance - Comments: Pt able to maintain safe sitting on the EOB with BIL UE assist and CGA from therapist for safety   Standing balance support: Bilateral upper extremity supported Standing balance-Leahy Scale: Fair Standing balance  comment: Pt with increased pain in the standing position requires BIL UE assist and minA, pt with decreased stability in the standing position                             Pertinent Vitals/Pain Pain Assessment: Faces Faces Pain Scale: Hurts worst Pain Location: Lumbar region of back and down left side Pain Descriptors / Indicators: Grimacing;Aching;Guarding;Constant Pain Intervention(s): Limited activity within patient's tolerance;Monitored  during session;Premedicated before session;Repositioned    Home Living Family/patient expects to be discharged to:: Private residence Living Arrangements: Spouse/significant other Available Help at Discharge: Family;Available 24 hours/day Type of Home: House Home Access: Level entry     Home Layout: Two level;Able to live on main level with bedroom/bathroom Home Equipment: Kasandra Knudsen - single point;Walker - 2 wheels;Grab bars - toilet;Grab bars - tub/shower      Prior Function Level of Independence: Independent with assistive device(s)         Comments: Indep without assist device for ADLs, household mobility; [place] for longer, community distances (due to chronic L hip pain).  Denies fall history.     Hand Dominance        Extremity/Trunk Assessment        Lower Extremity Assessment Lower Extremity Assessment: Generalized weakness    Cervical / Trunk Assessment Cervical / Trunk Assessment: Kyphotic;Lordotic  Communication   Communication: No difficulties  Cognition Arousal/Alertness: Awake/alert Behavior During Therapy: WFL for tasks assessed/performed Overall Cognitive Status: Within Functional Limits for tasks assessed                                 General Comments: Pt willing to participate in interventions, but limited due to severity of LBP      General Comments      Exercises     Assessment/Plan    PT Assessment Patient needs continued PT services  PT Problem List Decreased strength;Decreased activity tolerance;Decreased balance;Decreased range of motion;Decreased mobility;Pain       PT Treatment Interventions DME instruction;Gait training;Functional mobility training;Therapeutic activities;Therapeutic exercise;Balance training;Patient/family education    PT Goals (Current goals can be found in the Care Plan section)  Acute Rehab PT Goals Patient Stated Goal: Decreased LBP PT Goal Formulation: With patient Time For Goal Achievement:  02/07/21 Potential to Achieve Goals: Good    Frequency Min 2X/week   Barriers to discharge   Pain    Co-evaluation               AM-PAC PT "6 Clicks" Mobility  Outcome Measure Help needed turning from your back to your side while in a flat bed without using bedrails?: A Little Help needed moving from lying on your back to sitting on the side of a flat bed without using bedrails?: A Little Help needed moving to and from a bed to a chair (including a wheelchair)?: A Little Help needed standing up from a chair using your arms (e.g., wheelchair or bedside chair)?: A Little Help needed to walk in hospital room?: A Lot Help needed climbing 3-5 steps with a railing? : A Lot 6 Click Score: 16    End of Session Equipment Utilized During Treatment: Gait belt Activity Tolerance: Patient limited by pain Patient left: in bed;with call bell/phone within reach;with bed alarm set Nurse Communication: Mobility status PT Visit Diagnosis: Difficulty in walking, not elsewhere classified (R26.2);Muscle weakness (generalized) (M62.81)    Time:  3403-7096 PT Time Calculation (min) (ACUTE ONLY): 35 min   Charges:   PT Evaluation $PT Eval Low Complexity: 1 Low PT Treatments $Therapeutic Activity: 8-22 mins        Duanne Guess, PT, DPT 01/24/21, 1:31 PM   Marbeth Smedley [place] 01/24/2021, 1:22 PM

## 2021-01-24 NOTE — TOC Initial Note (Signed)
Transition of Care [facility]) - Initial/Assessment Note    Patient Details  Name: Janice Tyler MRN: [id number] Date of Birth: 1946-10-04  Transition of Care [facility]) CM/SW Contact:    Magnus Ivan, LCSW Phone Number: 01/24/2021, 4:26 PM  Clinical Narrative:                CSW spoke with patient regarding discharge planning. Patient lives with her husband. Patient either drives herself or family provides transportation to appointments. PCP is Dr. Ouida Sills. Pharmacy is [facility]. Patient has a cane, RW, w/c, and 3 in 1 at home. Patient confirmed she is active with [facility]. Patient has had both COVID vaccines plus the booster shot. CSW explained PT recommendation for SNF. Patient declined SNF, reported she lives with her husband and feels she can safely return home and resume [place] services.    Expected Discharge Plan: [place] Barriers to Discharge: Continued Medical Work up   Patient Goals and CMS Choice Patient states their goals for this hospitalization and ongoing recovery are:: home with home health CMS Medicare.gov Compare Post Acute Care list provided to:: Patient Choice offered to / list presented to : Patient  Expected Discharge Plan and Services Expected Discharge Plan: [place]       Living arrangements for the past 2 months: Single Family Home                                      Prior Living Arrangements/Services Living arrangements for the past 2 months: Single Family Home Lives with:: Spouse Patient language and need for interpreter reviewed:: Yes Do you feel safe going back to the place where you live?: Yes      Need for Family Participation in Patient Care: Yes (Comment) Care giver support system in place?: Yes (comment) Current home services: DME,Home OT,Home PT Criminal Activity/Legal Involvement Pertinent to Current Situation/Hospitalization: No - Comment as needed  Activities of Daily Living Home  Assistive Devices/Equipment: Cane (specify quad or straight) ADL Screening (condition at time of admission) Patient's cognitive ability adequate to safely complete daily activities?: Yes Is the patient deaf or have difficulty hearing?: No Does the patient have difficulty seeing, even when wearing glasses/contacts?: No Does the patient have difficulty concentrating, remembering, or making decisions?: No Patient able to express need for assistance with ADLs?: Yes Does the patient have difficulty dressing or bathing?: No Independently performs ADLs?: Yes (appropriate for developmental age) Does the patient have difficulty walking or climbing stairs?: No Weakness of Legs: Left Weakness of Arms/Hands: None  Permission Sought/Granted Permission sought to share information with : Chartered certified accountant granted to share information with : Yes, Verbal Permission Granted     Permission granted to share info w AGENCY: HH, DME agencies        Emotional Assessment       Orientation: : Oriented to Self,Oriented to Place,Oriented to  Time,Oriented to Situation Alcohol / Substance Use: Not Applicable Psych Involvement: No (comment)  Admission diagnosis:  Back pain [M54.9] Paraspinal mass [R22.2] Patient Active Problem List   Diagnosis Date Noted  . Paraspinal mass   . Back pain 01/23/2021  . CKD (chronic kidney disease), stage IIIa 01/22/2021  . Acute metabolic encephalopathy 02/58/5277  . Stroke ([place]) 01/15/2021  . Slurred speech 01/15/2021  . HLD (hyperlipidemia) 01/15/2021  . CAD (coronary artery disease) 01/15/2021  . GERD (  gastroesophageal reflux disease) 01/15/2021  . Seizure ([place]) 01/15/2021  . Chronic diastolic CHF (congestive heart failure) ([place]) 01/15/2021  . Absence seizures, intractable ([place]) 08/26/2020  . Acid reflux disease 08/26/2020  . COPD (chronic obstructive pulmonary disease) ([place]) 08/26/2020  . 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 Joint replaced  08/26/2020  . 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 Osteoporosis 08/26/2020  . Carotid bruit 08/26/2020  . Use of cane as ambulatory aid 08/23/2018  . Controlled type 2 diabetes mellitus with complication, without long-term current use of insulin ([place]) 02/21/2018  . Nonintractable epilepsy without status epilepticus ([place]) 06/06/2017  . Peripheral neuropathy, idiopathic 06/06/2017  . Aortic atherosclerosis ([place]) 04/12/2017  . Coronary artery disease involving native coronary artery of native heart without angina pectoris 04/12/2017  . Healthcare maintenance 01/06/2017  . Chronic bronchitis ([place]) 09/28/2016  . Essential hypertension 09/28/2016  . Hypercholesterolemia 09/28/2016  . Incidental lung nodule 09/28/2016  . 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 Urinary incontinence in female 11/08/2008   PCP:  Kirk Ruths, MD Pharmacy:   [id number] [place], [address] [place] [address] Phone: [phone] Fax: [phone]     Social Determinants of Health (SDOH) Interventions    Readmission Risk Interventions No flowsheet data found.

## 2021-01-24 NOTE — Consult Note (Signed)
Pharmacy Antibiotic Note  Janice Tyler is a 75 y.o. female with medical history including chronic lower back pain admitted on 01/23/2021 with paraspinal abscess. History of recent scheduled procedure for lumbar spine MBB/RFA bilateral L4-L5 and L5-S1 facet joints of which she completed the right side. Procedure was aborted early due to patient developing slurred speech and decreased consciousness for which she was sent to the ED. Patient is status post aspiration of abscess by IR. Pharmacy has been consulted for vancomycin and Unasyn dosing.  Plan: Unasyn 3 g IV q6h  Vancomycin 1500 mg (23 mg/kg) LD x 1 followed by maintenance dose of vancomycin 1000 mg IV q24h --Calculated AUC: 541, Cmin 14 --Daily Scr per protocol while on IV vancomycin --Levels at steady state as indicated  Continue to monitor renal function and adjust antibiotics as indicated. Continue to follow culture results and ability to narrow antibiotics.  Height: 5' 2.5" (158.8 cm) Weight: 64 kg (141 lb 1.5 oz) IBW/kg (Calculated) : 51.25  Temp (24hrs), Avg:98.5 F (36.9 C), Min:98.3 F (36.8 C), Max:98.8 F (37.1 C)  Recent Labs  Lab 01/18/21 0436 01/23/21 1340 01/24/21 0521  WBC 6.6 15.8* 11.7*  CREATININE  --  0.99 0.88    Estimated Creatinine Clearance: 48.4 mL/min (by C-G formula based on SCr of 0.88 mg/dL).    No Known Allergies  Antimicrobials this admission: Unasyn 3/19 >>  Vancomycin 3/19 >>   Dose adjustments this admission: N/A  Microbiology results: 3/19 Abscess Cx: pending 3/19 BCx: pending 3/19 MRSA PCR: pending  Thank you for allowing pharmacy to be a part of this patient's care.  Benita Gutter 01/24/2021 1:27 PM

## 2021-01-24 NOTE — Progress Notes (Signed)
[place] for Warfarin  Indication: Afib  No Known Allergies  Patient Measurements: Height: 5' 2.5" (158.8 cm) Weight: 64 kg (141 lb 1.5 oz) IBW/kg (Calculated) : 51.25 Heparin Dosing Weight:    Vital Signs: Temp: 98.3 F (36.8 C) (03/19 0757) BP: 141/64 (03/19 1030) Pulse Rate: 57 (03/19 1030)  Labs: Recent Labs    01/23/21 1340 01/23/21 1805 01/24/21 0521  HGB 12.1  --  11.8*  HCT 37.8  --  36.8  PLT 220  --  197  APTT  --  38*  --   LABPROT  --  20.4* 18.4*  INR  --  1.8* 1.6*  CREATININE 0.99  --  0.88    Estimated Creatinine Clearance: 48.4 mL/min (by C-G formula based on SCr of 0.88 mg/dL).   Medical History: Past Medical History:  Diagnosis Date  . CHF (congestive heart failure) ([place])   . Chronic kidney disease    CKD  . COPD (chronic obstructive pulmonary disease) ([place])   . Dysrhythmia    Atrial Fibrillation  . GERD (gastroesophageal reflux disease)   . Hypertension   . Seizures ([place])     Assessment: Patient is 75yo female being admitted with leg/buttock pain and inability to ambulate. Pharmacy consulted to resume Coumadin when okay with IR. Patient is tentatively planned for IR procedure on Saturday. S/p CT guided aspiration R paraspinal collection hematoma, bleeding risk associated with procedure: low. No major DDI with warfarin.   INR on admission 1.8 Home dose Coumadin 7 mg daily  Date INR Warfarin Dose  3/19 1.9 7 mg         Goal of Therapy:  INR 2-3  Plan:  INR subtherapeutic. Will give warfarin 7 mg x 1 tonight (home dose). Daily INR ordered. CBC at least every 3 days.   Eleonore Chiquito, PharmD, BCPS 01/24/2021 10:40 AM

## 2021-01-24 NOTE — Procedures (Signed)
  Procedure: CT guided aspiration R paraspinal collection hematoma, 68ml aspirate sent for GS, C&S EBL:   minimal Complications:  none immediate  See full dictation in [facility].  Dillard Cannon MD Main # [phone] Pager  [phone] Mobile [phone]

## 2021-01-25 DIAGNOSIS — E1169 Type 2 diabetes mellitus with other specified complication: Secondary | ICD-10-CM

## 2021-01-25 DIAGNOSIS — R569 Unspecified convulsions: Secondary | ICD-10-CM

## 2021-01-25 LAB — CBC
HCT: 34.3 % — ABNORMAL LOW (ref 36.0–46.0)
Hemoglobin: 10.9 g/dL — ABNORMAL LOW (ref 12.0–15.0)
MCH: 26.4 pg (ref 26.0–34.0)
MCHC: 31.8 g/dL (ref 30.0–36.0)
MCV: 83.1 fL (ref 80.0–100.0)
Platelets: 168 10*3/uL (ref 150–400)
RBC: 4.13 MIL/uL (ref 3.87–5.11)
RDW: 13.9 % (ref 11.5–15.5)
WBC: 7.2 10*3/uL (ref 4.0–10.5)
nRBC: 0 % (ref 0.0–0.2)

## 2021-01-25 LAB — PROTIME-INR
INR: 1.7 — ABNORMAL HIGH (ref 0.8–1.2)
Prothrombin Time: 19.4 seconds — ABNORMAL HIGH (ref 11.4–15.2)

## 2021-01-25 LAB — CREATININE, SERUM
Creatinine, Ser: 1.06 mg/dL — ABNORMAL HIGH (ref 0.44–1.00)
GFR, Estimated: 54 mL/min — ABNORMAL LOW (ref >60)

## 2021-01-25 LAB — PROCALCITONIN: Procalcitonin: <0.1 ng/mL

## 2021-01-25 MED ORDER — WARFARIN SODIUM 6 MG PO TABS
7.0000 mg | ORAL_TABLET | Freq: Once | ORAL | Status: AC
  Administered 2021-01-25: 7 mg via ORAL
  Filled 2021-01-25: qty 1

## 2021-01-25 MED ORDER — TRAMADOL HCL 50 MG PO TABS
50.0000 mg | ORAL_TABLET | Freq: Four times a day (QID) | ORAL | Status: DC | PRN
  Administered 2021-01-25 – 2021-01-27 (×8): 50 mg via ORAL
  Filled 2021-01-25 (×9): qty 1

## 2021-01-25 MED ORDER — VANCOMYCIN HCL 750 MG/150ML IV SOLN
750.0000 mg | INTRAVENOUS | Status: DC
  Administered 2021-01-25: 750 mg via INTRAVENOUS
  Filled 2021-01-25 (×2): qty 150

## 2021-01-25 NOTE — Progress Notes (Addendum)
[place] for Warfarin  Indication: Afib  No Known Allergies  Patient Measurements: Height: 5' 2.5" (158.8 cm) Weight: 64.7 kg (142 lb 10.2 oz) IBW/kg (Calculated) : 51.25 Heparin Dosing Weight:    Vital Signs: Temp: 98.6 F (37 C) (03/20 0743) Temp Source: Oral (03/20 0429) BP: 114/59 (03/20 0743) Pulse Rate: 55 (03/20 0743)  Labs: Recent Labs    01/23/21 1340 01/23/21 1805 01/24/21 0521 01/25/21 0549  HGB 12.1  --  11.8* 10.9*  HCT 37.8  --  36.8 34.3*  PLT 220  --  197 168  APTT  --  38*  --   --   LABPROT  --  20.4* 18.4* 19.4*  INR  --  1.8* 1.6* 1.7*  CREATININE 0.99  --  0.88 1.06*    Estimated Creatinine Clearance: 40.4 mL/min (A) (by C-G formula based on SCr of 1.06 mg/dL (H)).   Medical History: Past Medical History:  Diagnosis Date  . CHF (congestive heart failure) ([place])   . Chronic kidney disease    CKD  . COPD (chronic obstructive pulmonary disease) ([place])   . Dysrhythmia    Atrial Fibrillation  . GERD (gastroesophageal reflux disease)   . Hypertension   . Seizures ([place])     Assessment: Patient is 75yo female being admitted with leg/buttock pain and inability to ambulate. Pharmacy consulted to resume Coumadin when okay with IR. Patient is tentatively planned for IR procedure on Saturday. S/p CT guided aspiration R paraspinal collection hematoma, bleeding risk associated with procedure: low. No major DDI with warfarin, but started on unasyn + vancomycin 3/19.   INR on admission 1.8 Home dose Coumadin 7 mg daily  Date INR Warfarin Dose  3/19 1.6 7 mg  3/20 1.7 7 mg     Goal of Therapy:  INR 2-3  Plan:  INR subtherapeutic. Will give warfarin 7 mg x 1 tonight (home dose). Daily INR ordered. CBC at least every 3 days.   Eleonore Chiquito, PharmD, BCPS 01/25/2021 10:02 AM

## 2021-01-25 NOTE — Consult Note (Signed)
Pharmacy Antibiotic Note  Janice Tyler is a 75 y.o. female with medical history including chronic lower back pain admitted on 01/23/2021 with paraspinal abscess. History of recent scheduled procedure for lumbar spine MBB/RFA bilateral L4-L5 and L5-S1 facet joints of which she completed the right side. Procedure was aborted early due to patient developing slurred speech and decreased consciousness for which she was sent to the ED. Patient is status post aspiration of abscess by IR. Pharmacy has been consulted for vancomycin and Unasyn dosing.  Plan: Unasyn 3 g IV q6h  Vancomycin 1500 mg (23 mg/kg) LD x 1 followed by maintenance dose of vancomycin 1000 mg IV q24h. Adjusted to 750 mg q24H due to Scr trending up.  --Calculated AUC 463: , Cmin 12 --Daily Scr per protocol while on IV vancomycin --plan to obtain level in 3-4 days.   Continue to monitor renal function and adjust antibiotics as indicated. Continue to follow culture results and ability to narrow antibiotics.  Height: 5' 2.5" (158.8 cm) Weight: 64.7 kg (142 lb 10.2 oz) IBW/kg (Calculated) : 51.25  Temp (24hrs), Avg:98.7 F (37.1 C), Min:98 F (36.7 C), Max:100.6 F (38.1 C)  Recent Labs  Lab 01/23/21 1340 01/24/21 0521 01/25/21 0549  WBC 15.8* 11.7* 7.2  CREATININE 0.99 0.88 1.06*    Estimated Creatinine Clearance: 40.4 mL/min (A) (by C-G formula based on SCr of 1.06 mg/dL (H)).    No Known Allergies  Antimicrobials this admission: Unasyn 3/19 >>  Vancomycin 3/19 >>   Dose adjustments this admission: N/A  Microbiology results: 3/19 Abscess Cx: pending 3/19 BCx: pending 3/19 MRSA PCR: negative  Thank you for allowing pharmacy to be a part of this patient's care.  Oswald Hillock 01/25/2021 10:06 AM

## 2021-01-25 NOTE — Progress Notes (Addendum)
Patient ID: Janice Tyler, female   DOB: Mar 04, 1946, 75 y.o.   MRN: [id number] [facility] PROGRESS NOTE  Ranee Peasley YJE:[id number] DOB: 1946/05/22 DOA: 01/23/2021 PCP: Kirk Ruths, MD  HPI/Subjective: Patient states that she does not want a hydrocodone for pain.  Was okay with the IV medication.  I changed the oxycodone over the hydrocodone yesterday.  We will try tramadol today.  Patient still having pain in her back.  Had low-grade temperature.  Objective: Vitals:   01/25/21 0743 01/25/21 1228  BP: (!) 114/59 125/78  Pulse: (!) 55 69  Resp: 17 16  Temp: 98.6 F (37 C) 98.3 F (36.8 C)  SpO2: 100% 99%    Intake/Output Summary (Last 24 hours) at 01/25/2021 1514 Last data filed at 01/25/2021 1022 Gross per 24 hour  Intake 619.63 ml  Output 1050 ml  Net -430.37 ml   Filed Weights   01/23/21 1342 01/24/21 0257 01/25/21 0429  Weight: 65.8 kg 64 kg 64.7 kg    ROS: Review of Systems  Respiratory: Negative for shortness of breath.   Cardiovascular: Negative for chest pain.  Gastrointestinal: Negative for abdominal pain, nausea and vomiting.  Musculoskeletal: Positive for back pain.   Exam: Physical Exam HENT:     Head: Normocephalic.     Mouth/Throat:     Pharynx: No oropharyngeal exudate.  Eyes:     General: Lids are normal.     Conjunctiva/sclera: Conjunctivae normal.  Cardiovascular:     Rate and Rhythm: Normal rate and regular rhythm.     Heart sounds: Normal heart sounds, S1 normal and S2 normal.  Pulmonary:     Breath sounds: Normal breath sounds. No decreased breath sounds, wheezing, rhonchi or rales.  Abdominal:     Palpations: Abdomen is soft.     Tenderness: There is no abdominal tenderness.  Musculoskeletal:     Right lower leg: No swelling.     Left lower leg: No swelling.  Skin:    General: Skin is warm.     Findings: No rash.     Comments: Bruising bilateral back.  Neurological:     Mental Status: She is alert and oriented to person,  place, and time.     Comments: Patient able to move her legs back and forth while on her side.  Did not want to lie on her back secondary to pain.       Data Reviewed: Basic Metabolic Panel: Recent Labs  Lab 01/23/21 1340 01/24/21 0521 01/25/21 0549  NA 132* 135  --   K 4.2 4.4  --   CL 101 102  --   CO2 19* 25  --   GLUCOSE 215* 121*  --   BUN 23 24*  --   CREATININE 0.99 0.88 1.06*  CALCIUM 10.2 9.8  --    Liver Function Tests: Recent Labs  Lab 01/23/21 1340 01/24/21 0521  AST 70* 49*  ALT 76* 66*  ALKPHOS 88 74  BILITOT 1.0 1.4*  PROT 7.9 7.4  ALBUMIN 4.5 4.1   Recent Labs  Lab 01/23/21 1340  LIPASE 46   CBC: Recent Labs  Lab 01/23/21 1340 01/24/21 0521 01/25/21 0549  WBC 15.8* 11.7* 7.2  NEUTROABS  --  9.7*  --   HGB 12.1 11.8* 10.9*  HCT 37.8 36.8 34.3*  MCV 82.0 81.6 83.1  PLT 220 197 168    CBG: Recent Labs  Lab 01/24/21 0132 01/24/21 0416 01/24/21 0754 01/24/21 1203 01/24/21 1605  GLUCAP 122* 124* 121*  136* 145*    Recent Results (from the past 240 hour(s))  SARS CORONAVIRUS 2 (TAT 6-24 HRS) Nasopharyngeal Nasopharyngeal Swab     Status: None   Collection Time: 01/15/21  2:56 PM   Specimen: Nasopharyngeal Swab  Result Value Ref Range Status   SARS Coronavirus 2 NEGATIVE NEGATIVE Final    Comment: (NOTE) SARS-CoV-2 target nucleic acids are NOT DETECTED.  The SARS-CoV-2 RNA is generally detectable in upper and lower respiratory specimens during the acute phase of infection. Negative results do not preclude SARS-CoV-2 infection, do not rule out co-infections with other pathogens, and should not be used as the sole basis for treatment or other patient management decisions. Negative results must be combined with clinical observations, patient history, and epidemiological information. The expected result is Negative.  Fact Sheet for Patients: SugarRoll.be  Fact Sheet for Healthcare  Providers: https://www.woods-mathews.com/  This test is not yet approved or cleared by the [place] FDA and  has been authorized for detection and/or diagnosis of SARS-CoV-2 by FDA under an Emergency Use Authorization (EUA). This EUA will remain  in effect (meaning this test can be used) for the duration of the COVID-19 declaration under Se ction 564(b)(1) of the Act, 21 U.S.C. section 360bbb-3(b)(1), unless the authorization is terminated or revoked sooner.  Performed at [facility], [place] [address] [place], [place] [address]   CULTURE, BLOOD (ROUTINE X 2) w Reflex to ID Panel     Status: None (Preliminary result)   Collection Time: 01/23/21 12:04 AM   Specimen: BLOOD  Result Value Ref Range Status   Specimen Description BLOOD  L HAND  Final   Special Requests   Final    BOTTLES DRAWN AEROBIC AND ANAEROBIC Blood Culture adequate volume   Culture   Final    NO GROWTH 1 DAY Performed at [facility], [address] [place], [place] [address]    Report Status PENDING  Incomplete  Resp Panel by RT-PCR (Flu A&B, Covid) Nasopharyngeal Swab     Status: None   Collection Time: 01/23/21  4:38 PM   Specimen: Nasopharyngeal Swab; Nasopharyngeal(NP) swabs in vial transport medium  Result Value Ref Range Status   SARS Coronavirus 2 by RT PCR NEGATIVE NEGATIVE Final    Comment: (NOTE) SARS-CoV-2 target nucleic acids are NOT DETECTED.  The SARS-CoV-2 RNA is generally detectable in upper respiratory specimens during the acute phase of infection. The lowest concentration of SARS-CoV-2 viral copies this assay can detect is 138 copies/mL. A negative result does not preclude SARS-Cov-2 infection and should not be used as the sole basis for treatment or other patient management decisions. A negative result may occur with  improper specimen collection/handling, submission of specimen other than nasopharyngeal swab, presence of viral mutation(s) within the areas  targeted by this assay, and inadequate number of viral copies(<138 copies/mL). A negative result must be combined with clinical observations, patient history, and epidemiological information. The expected result is Negative.  Fact Sheet for Patients:  EntrepreneurPulse.com.au  Fact Sheet for Healthcare Providers:  IncredibleEmployment.be  This test is no t yet approved or cleared by the [place] FDA and  has been authorized for detection and/or diagnosis of SARS-CoV-2 by FDA under an Emergency Use Authorization (EUA). This EUA will remain  in effect (meaning this test can be used) for the duration of the COVID-19 declaration under Section 564(b)(1) of the Act, 21 U.S.C.section 360bbb-3(b)(1), unless the authorization is terminated  or revoked sooner.  Influenza A by PCR NEGATIVE NEGATIVE Final   Influenza B by PCR NEGATIVE NEGATIVE Final    Comment: (NOTE) The Xpert Xpress SARS-CoV-2/FLU/RSV plus assay is intended as an aid in the diagnosis of influenza from Nasopharyngeal swab specimens and should not be used as a sole basis for treatment. Nasal washings and aspirates are unacceptable for Xpert Xpress SARS-CoV-2/FLU/RSV testing.  Fact Sheet for Patients: EntrepreneurPulse.com.au  Fact Sheet for Healthcare Providers: IncredibleEmployment.be  This test is not yet approved or cleared by the [place] FDA and has been authorized for detection and/or diagnosis of SARS-CoV-2 by FDA under an Emergency Use Authorization (EUA). This EUA will remain in effect (meaning this test can be used) for the duration of the COVID-19 declaration under Section 564(b)(1) of the Act, 21 U.S.C. section 360bbb-3(b)(1), unless the authorization is terminated or revoked.  Performed at [facility], [place] [place], [place] [address]   MRSA PCR Screening     Status: None   Collection Time:  01/24/21 12:04 AM   Specimen: Nasopharyngeal  Result Value Ref Range Status   MRSA by PCR NEGATIVE NEGATIVE Final    Comment:        The GeneXpert MRSA Assay (FDA approved for NASAL specimens only), is one component of a comprehensive MRSA colonization surveillance program. It is not intended to diagnose MRSA infection nor to guide or monitor treatment for MRSA infections. Performed at [facility], [address] [place], [place] [address]   CULTURE, BLOOD (ROUTINE X 2) w Reflex to ID Panel     Status: None (Preliminary result)   Collection Time: 01/24/21 12:09 AM   Specimen: BLOOD  Result Value Ref Range Status   Specimen Description BLOOD  L ARM  Final   Special Requests   Final    BOTTLES DRAWN AEROBIC AND ANAEROBIC Blood Culture adequate volume   Culture   Final    NO GROWTH 1 DAY Performed at [facility], [address] [place], [place] [address]    Report Status PENDING  Incomplete  Aerobic/Anaerobic Culture (surgical/deep wound)     Status: None (Preliminary result)   Collection Time: 01/24/21 10:23 AM   Specimen: Wound  Result Value Ref Range Status   Specimen Description   Final    WOUND RIGHT LUMBAR HEMOTOMA Performed at [facility], [place] [address], [place], [place] [address]    Special Requests   Final    NONE Performed at [facility], [address] [place], [place] [address]    Gram Stain PENDING  Incomplete   Culture   Final    NO GROWTH < 12 HOURS Performed at [facility], [place] [address], [place], [place] [address]    Report Status PENDING  Incomplete     Studies: MR LUMBAR SPINE WO CONTRAST  Result Date: 01/23/2021 CLINICAL DATA:  Low back pain and right abdominal pain EXAM: MRI LUMBAR SPINE WITHOUT CONTRAST TECHNIQUE: Multiplanar, multisequence MR imaging of the lumbar spine was performed. No intravenous contrast was administered. COMPARISON:  01/06/2018 MRI lumbar spine. FINDINGS: The patient was  not able to tolerate the axial T1 weighted images and terminated the exam prior to their position. Despite efforts by the technologist and patient, motion artifact is present on today's exam and could not be eliminated. This reduces exam sensitivity and specificity. Segmentation: The lowest lumbar type non-rib-bearing vertebra is labeled as L5. Alignment: 3 mm degenerative anterolisthesis at L4-5, unchanged from 01/06/2018 Vertebrae: Disc desiccation  at all levels between L2 and S1 but without substantial loss of disc height. No significant vertebral marrow edema is identified. Conus medullaris and cauda equina: Conus extends to the upper L2 level. Conus and cauda equina appear normal. Paraspinal and other soft tissues: In the right paraspinal musculature (longissimus thoracis and iliocostalis lumborum muscles) extending from the L2 level through the L5-S1 level, we demonstrate a 4.1 by 3.9 by 13.0 cm (volume = 110 cm^3) serpentine masslike structure with high T2 and low T1 signal characteristics potentially representing hematoma or abscess. Disc levels: T10-11 and T11-12: Mild disc bulge without impingement. T12-L1: Mild disc bulge without impingement. L1-2: Unremarkable. L2-3: Unremarkable. L3-4: Borderline left foraminal stenosis due to disc bulge and left facet arthropathy. L4-5: Mild left foraminal stenosis due to disc uncovering, left foraminal disc protrusion, and facet arthropathy. L5-S1: Mild to moderate right and mild left foraminal stenosis due to disc bulge and facet arthropathy. There is also likely a right synovial cyst. IMPRESSION: 1. In the right paraspinal musculature (longissimus thoracis and iliocostalis lumborum) muscles extending from L2 through L5-S1, a 4.1 by 3.9 by 13.0 cm (volume = 110 cm^3) masslike structure with high T2 and low T1 signal characteristics potentially representing hematoma or abscess. 2. Lumbar spondylosis and degenerative disc disease, causing mild to moderate impingement  at L5-S1 and mild impingement at L4-5. Electronically Signed   By: Van Clines M.D.   On: 01/23/2021 19:05   CT ASPIRATION  Result Date: 01/25/2021 CLINICAL DATA:  Recent lumbar spine percutaneous intervention with subsequent severe pain on the right. MR suggested paraspinal hematoma or abscess. Aspiration requested EXAM: CT GUIDED ASPIRATION BIOPSY OF PARASPINAL MASS ANESTHESIA/SEDATION: Intravenous Fentanyl 50mcg and Versed 1mg  were administered as conscious sedation during continuous monitoring of the patient's level of consciousness and physiological / cardiorespiratory status by the radiology RN, with a total moderate sedation time of 11 minutes. PROCEDURE: The procedure risks, benefits, and alternatives were explained to the patient. Questions regarding the procedure were encouraged and answered. The patient understands and consents to the procedure. patient placed in LAO position. select axial scans through the lumbar region obtained. hyperdense paralumbar intramuscular collection was localized, suggesting hematoma. Appropriate skin entry site was determined and marked. The operative field was prepped with chlorhexidinein a sterile fashion, and a sterile drape was applied covering the operative field. A sterile gown and sterile gloves were used for the procedure. Local anesthesia was provided with 1% Lidocaine. Under CT fluoroscopic guidance, a 5 [place] multi sidehole Yueh sheath needle advanced into the process. Approximately 35 mL old blood were aspirated, sent for Gram stain and culture. The Teressa Lower was removed. Postprocedure scan shows little interval change in the presumed intramuscular hematoma. The patient tolerated the procedure well. COMPLICATIONS: None immediate FINDINGS: Hyperdense intramuscular right paraspinal collection consistent with hematoma was localized. 5 mL of old blood were aspirated, sent for Gram stain and culture. IMPRESSION: 1. Right paraspinal intramuscular hematoma. 5 mL  aspirate was sent for Gram stain and culture. Electronically Signed   By: Lucrezia Europe M.D.   On: 01/25/2021 15:03   DG Hip Unilat W or Wo Pelvis 2-3 Views Right  Result Date: 01/23/2021 CLINICAL DATA:  Right side abdominal pain radiating to the legs and back. EXAM: DG HIP (WITH OR WITHOUT PELVIS) 2-3V RIGHT COMPARISON:  None. FINDINGS: There is no acute bony or joint abnormality. 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 Soft tissues are negative. IMPRESSION: No acute abnormality. Electronically Signed   By: Inge Rise M.D.  On: 01/23/2021 17:54    Scheduled Meds: . amLODipine  5 mg Oral Daily  . atorvastatin  40 mg Oral QHS  . fluticasone furoate-vilanterol  1 puff Inhalation Daily   And  . umeclidinium bromide  1 puff Inhalation Daily  . lamoTRIgine  150 mg Oral BID  . metoprolol tartrate  50 mg Oral BID  . pantoprazole  40 mg Oral Daily  . pneumococcal 23 valent vaccine  0.5 mL Intramuscular Tomorrow-1000  . senna-docusate  1 tablet Oral QHS  . warfarin  7 mg Oral ONCE-1600  . Warfarin - Pharmacist Dosing Inpatient   Does not apply q1600   Continuous Infusions: . ampicillin-sulbactam (UNASYN) IV 3 g (01/25/21 0928)  . vancomycin      Assessment/Plan:  1. Severe back pain.  Most likely hematoma.  Culture sent yesterday.  So far no growth but Gram stain still pending.  Empiric antibiotic started yesterday with vancomycin and Unasyn.  Physical therapy evaluation appreciated.  Trying to get a good pain medication that the patient can tolerate we will try tramadol. 2. Mechanical heart valve.  Continue Coumadin.  INR 1.7 today.  Holding off on Lovenox injections or heparin drip secondary to hematoma of the back. 3. Essential hypertension on metoprolol and Norvasc 4. GERD on PPI 5. Seizure disorder on lamotrigine 6. COPD-continue inhalers 7. Type 2 diabetes mellitus with hyperlipidemia.  Hemoglobin A1c borderline at 6.5.  Diet controlled for right now.  Discussed diet with the  patient.  Continue statin.        Code Status:     Code Status Orders  (From admission, onward)         Start     Ordered   01/24/21 0046  Full code  Continuous        01/24/21 0045        Code Status History    Date Active Date Inactive Code Status Order ID Comments User Context   01/15/2021 1508 01/18/2021 1934 Full Code [id number]  Ivor Costa, MD ED   [facility] Care Planning Activity     Family Communication: Spoke with husband on the phone Disposition Plan: Status is: Inpatient  Dispo:  Patient From: Home  Planned Disposition: Home with Health Care Svc versus rehab.  Medically stable for discharge: No.  Waiting for cultures to come back and pain to be better controlled with oral medications   Time spent: 27 minutes  [place]

## 2021-01-25 NOTE — Plan of Care (Signed)
  Problem: Education: Goal: Ability to verbalize activity precautions or restrictions will improve Outcome: Progressing Goal: Knowledge of the prescribed therapeutic regimen will improve Outcome: Progressing Goal: Understanding of discharge needs will improve Outcome: Progressing   Problem: Activity: Goal: Ability to avoid complications of mobility impairment will improve Outcome: Progressing Goal: Ability to tolerate increased activity will improve Outcome: Progressing Goal: Will remain free from falls Outcome: Progressing   Problem: Bowel/Gastric: Goal: Gastrointestinal status for postoperative course will improve Outcome: Progressing   Problem: Pain Management: Goal: Pain level will decrease Outcome: Progressing   Problem: Skin Integrity: Goal: Will show signs of wound healing Outcome: Progressing   Problem: Health Behavior/Discharge Planning: Goal: Identification of resources available to assist in meeting health care needs will improve Outcome: Progressing   Problem: Bladder/Genitourinary: Goal: Urinary functional status for postoperative course will improve Outcome: Progressing

## 2021-01-26 DIAGNOSIS — T148XXA Other injury of unspecified body region, initial encounter: Secondary | ICD-10-CM

## 2021-01-26 LAB — BASIC METABOLIC PANEL
Anion gap: 5 (ref 5–15)
BUN: 22 mg/dL (ref 8–23)
CO2: 26 mmol/L (ref 22–32)
Calcium: 8.6 mg/dL — ABNORMAL LOW (ref 8.9–10.3)
Chloride: 106 mmol/L (ref 98–111)
Creatinine, Ser: 0.9 mg/dL (ref 0.44–1.00)
GFR, Estimated: >60 mL/min (ref >60)
Glucose, Bld: 115 mg/dL — ABNORMAL HIGH (ref 70–99)
Potassium: 4.4 mmol/L (ref 3.5–5.1)
Sodium: 137 mmol/L (ref 135–145)

## 2021-01-26 LAB — CBC
HCT: 31.4 % — ABNORMAL LOW (ref 36.0–46.0)
Hemoglobin: 10.1 g/dL — ABNORMAL LOW (ref 12.0–15.0)
MCH: 26.5 pg (ref 26.0–34.0)
MCHC: 32.2 g/dL (ref 30.0–36.0)
MCV: 82.4 fL (ref 80.0–100.0)
Platelets: 163 10*3/uL (ref 150–400)
RBC: 3.81 MIL/uL — ABNORMAL LOW (ref 3.87–5.11)
RDW: 13.8 % (ref 11.5–15.5)
WBC: 7 10*3/uL (ref 4.0–10.5)
nRBC: 0.3 % — ABNORMAL HIGH (ref 0.0–0.2)

## 2021-01-26 LAB — PROTIME-INR
INR: 1.8 — ABNORMAL HIGH (ref 0.8–1.2)
Prothrombin Time: 20.5 seconds — ABNORMAL HIGH (ref 11.4–15.2)

## 2021-01-26 MED ORDER — POLYETHYLENE GLYCOL 3350 17 G PO PACK
17.0000 g | PACK | Freq: Every day | ORAL | Status: DC
  Administered 2021-01-26 – 2021-01-27 (×2): 17 g via ORAL
  Filled 2021-01-26 (×2): qty 1

## 2021-01-26 MED ORDER — SENNA 8.6 MG PO TABS
2.0000 | ORAL_TABLET | Freq: Every day | ORAL | Status: DC
  Administered 2021-01-26: 17.2 mg via ORAL
  Filled 2021-01-26: qty 2

## 2021-01-26 MED ORDER — WARFARIN SODIUM 6 MG PO TABS
6.0000 mg | ORAL_TABLET | Freq: Once | ORAL | Status: AC
  Administered 2021-01-26: 6 mg via ORAL
  Filled 2021-01-26: qty 1

## 2021-01-26 MED ORDER — WARFARIN SODIUM 6 MG PO TABS
7.0000 mg | ORAL_TABLET | Freq: Once | ORAL | Status: DC
  Filled 2021-01-26: qty 1

## 2021-01-26 NOTE — Plan of Care (Signed)
  Problem: Pain Management: Goal: Pain level will decrease Outcome: Progressing   

## 2021-01-26 NOTE — Plan of Care (Signed)
  Problem: Education: Goal: Ability to verbalize activity precautions or restrictions will improve Outcome: Progressing Goal: Knowledge of the prescribed therapeutic regimen will improve Outcome: Progressing Goal: Understanding of discharge needs will improve Outcome: Progressing   Problem: Activity: Goal: Ability to avoid complications of mobility impairment will improve Outcome: Progressing Goal: Ability to tolerate increased activity will improve Outcome: Progressing Goal: Will remain free from falls Outcome: Progressing   Problem: Bowel/Gastric: Goal: Gastrointestinal status for postoperative course will improve Outcome: Progressing   Problem: Clinical Measurements: Goal: Ability to maintain clinical measurements within normal limits will improve Outcome: Progressing Goal: Postoperative complications will be avoided or minimized Outcome: Progressing Goal: Diagnostic test results will improve Outcome: Progressing   Problem: Pain Management: Goal: Pain level will decrease Outcome: Progressing   Problem: Skin Integrity: Goal: Will show signs of wound healing Outcome: Progressing   Problem: Health Behavior/Discharge Planning: Goal: Identification of resources available to assist in meeting health care needs will improve Outcome: Progressing   

## 2021-01-26 NOTE — Progress Notes (Signed)
[place] for Warfarin  Indication: Afib, Mech AVR  No Known Allergies  Patient Measurements: Height: 5' 2.5" (158.8 cm) Weight: 64.7 kg (142 lb 10.2 oz) IBW/kg (Calculated) : 51.25 Heparin Dosing Weight:    Vital Signs: Temp: 98.6 F (37 C) (03/21 1133) BP: 130/47 (03/21 1133) Pulse Rate: 56 (03/21 1133)  Labs: Recent Labs    01/23/21 1805 01/23/21 1805 01/24/21 0521 01/25/21 0549 01/26/21 0515  HGB  --    < > 11.8* 10.9* 10.1*  HCT  --   --  36.8 34.3* 31.4*  PLT  --   --  197 168 163  APTT 38*  --   --   --   --   LABPROT 20.4*  --  18.4* 19.4* 20.5*  INR 1.8*  --  1.6* 1.7* 1.8*  CREATININE  --   --  0.88 1.06* 0.90   < > = values in this interval not displayed.    Estimated Creatinine Clearance: 47.6 mL/min (by C-G formula based on SCr of 0.9 mg/dL).   Medical History: Past Medical History:  Diagnosis Date  . CHF (congestive heart failure) ([place])   . Chronic kidney disease    CKD  . COPD (chronic obstructive pulmonary disease) ([place])   . Dysrhythmia    Atrial Fibrillation  . GERD (gastroesophageal reflux disease)   . Hypertension   . Seizures ([place])     Assessment: Patient is 75yo female being admitted with leg/buttock pain and inability to ambulate. Pharmacy consulted to resume Coumadin when okay with IR. Patient is tentatively planned for IR procedure on Saturday. S/p CT guided aspiration R paraspinal collection hematoma, bleeding risk associated with procedure: low. No major DDI with warfarin, but started on unasyn + vancomycin 3/19.   INR on admission 1.8 Home dose Coumadin 7 mg daily  Date INR Warfarin Dose  3/19 1.6 7 mg  3/20 1.7 7 mg  3/21 1.8        DDI: Unasyn/Vanc  Goal of Therapy:  Goal INR 2.5 - 3.5 for history of MAVR and Afib  Plan:  INR subtherapeutic but starting to trend up.  PER TRH/Cardiology-desire to keep INR 1.8-1.9 due to hematoma. Will give warfarin 6 mg x 1 tonight .  -Holding  off on Lovenox injections or heparin drip secondary to hematoma of the back per MD note. Daily INR ordered. CBC at least every 3 days.   Chinita Greenland PharmD Clinical Pharmacist 01/26/2021

## 2021-01-26 NOTE — TOC Progression Note (Signed)
Transition of Care [facility]) - Progression Note    Patient Details  Name: Janice Tyler MRN: [id number] Date of Birth: 10-12-1946  [facility]) CM/SW Contact  Candie Chroman, LCSW Phone Number: 01/26/2021, 1:54 PM  Clinical Narrative:   CSW met with patient. She still wants to return home at discharge. [facility] is aware. May set up with home health social worker as well in case patient changes her mind about SNF once she returns home. Her husband is buying a used Transport planner and will pick it up either tonight or tomorrow.   Expected Discharge Plan: [place] Barriers to Discharge: Continued Medical Work up  Expected Discharge Plan and Services Expected Discharge Plan: [place] arrangements for the past 2 months: Single Family Home                                       Social Determinants of Health (SDOH) Interventions    Readmission Risk Interventions No flowsheet data found.

## 2021-01-26 NOTE — Progress Notes (Addendum)
[place] for Warfarin  Indication: Afib, Mech AVR  No Known Allergies  Patient Measurements: Height: 5' 2.5" (158.8 cm) Weight: 64.7 kg (142 lb 10.2 oz) IBW/kg (Calculated) : 51.25 Heparin Dosing Weight:    Vital Signs: Temp: 98.2 F (36.8 C) (03/21 0805) BP: 135/50 (03/21 0805) Pulse Rate: 60 (03/21 0805)  Labs: Recent Labs    01/23/21 1805 01/24/21 0521 01/25/21 0549 01/26/21 0515  HGB  --  11.8* 10.9* 10.1*  HCT  --  36.8 34.3* 31.4*  PLT  --  197 168 163  APTT 38*  --   --   --   LABPROT 20.4* 18.4* 19.4* 20.5*  INR 1.8* 1.6* 1.7* 1.8*  CREATININE  --  0.88 1.06* 0.90    Estimated Creatinine Clearance: 47.6 mL/min (by C-G formula based on SCr of 0.9 mg/dL).   Medical History: Past Medical History:  Diagnosis Date  . CHF (congestive heart failure) ([place])   . Chronic kidney disease    CKD  . COPD (chronic obstructive pulmonary disease) ([place])   . Dysrhythmia    Atrial Fibrillation  . GERD (gastroesophageal reflux disease)   . Hypertension   . Seizures ([place])     Assessment: Patient is 75yo female being admitted with leg/buttock pain and inability to ambulate. Pharmacy consulted to resume Coumadin when okay with IR. Patient is tentatively planned for IR procedure on Saturday. S/p CT guided aspiration R paraspinal collection hematoma, bleeding risk associated with procedure: low. No major DDI with warfarin, but started on unasyn + vancomycin 3/19.   INR on admission 1.8 Home dose Coumadin 7 mg daily  Date INR Warfarin Dose  3/19 1.6 7 mg  3/20 1.7 7 mg  3/21 1.8        DDI: Unasyn/Vanc  Goal of Therapy:  Goal INR 2.5 - 3.5 for history of MAVR and Afib  Plan:  INR subtherapeutic but starting to trend up. Will give warfarin 7 mg x 1 tonight (home dose).  -Holding off on Lovenox injections or heparin drip secondary to hematoma of the back per MD note. Daily INR ordered. CBC at least every 3 days.   Chinita Greenland PharmD Clinical Pharmacist 01/26/2021

## 2021-01-26 NOTE — Progress Notes (Addendum)
Patient ID: Janice Tyler, female   DOB: 1945-11-26, 75 y.o.   MRN: [id number] [facility] PROGRESS NOTE  Kaliope Quinonez VOJ:[id number] DOB: Sep 12, 1946 DOA: 01/23/2021 PCP: Kirk Ruths, MD  HPI/Subjective: Patient still having a lot of back pain.  Tramadol working a little bit better than the other pain medications.  Still having a lot of pain in the back.  Would rather go home into rehab.  Admitted with large hematoma of the back  Objective: Vitals:   01/26/21 0805 01/26/21 1133  BP: (!) 135/50 (!) 130/47  Pulse: 60 (!) 56  Resp: 16 16  Temp: 98.2 F (36.8 C) 98.6 F (37 C)  SpO2: 99% 99%    Intake/Output Summary (Last 24 hours) at 01/26/2021 1432 Last data filed at 01/26/2021 1017 Gross per 24 hour  Intake 360 ml  Output 850 ml  Net -490 ml   Filed Weights   01/23/21 1342 01/24/21 0257 01/25/21 0429  Weight: 65.8 kg 64 kg 64.7 kg    ROS: Review of Systems  Respiratory: Negative for shortness of breath.   Cardiovascular: Negative for chest pain.  Gastrointestinal: Negative for abdominal pain, nausea and vomiting.  Musculoskeletal: Positive for back pain.   Exam: Physical Exam HENT:     Head: Normocephalic.  Eyes:     General: Lids are normal.     Conjunctiva/sclera: Conjunctivae normal.  Cardiovascular:     Rate and Rhythm: Normal rate and regular rhythm.     Heart sounds: Normal heart sounds, S1 normal and S2 normal.  Pulmonary:     Breath sounds: Normal breath sounds. No decreased breath sounds, wheezing, rhonchi or rales.  Abdominal:     Palpations: Abdomen is soft.     Tenderness: There is no abdominal tenderness.  Musculoskeletal:     Right lower leg: No swelling.     Left lower leg: No swelling.  Skin:    General: Skin is warm.     Findings: No rash.     Comments: Bruising bilateral back.  Pain to palpation over right back.  Neurological:     Mental Status: She is alert and oriented to person, place, and time.     Comments: Able to move her  right leg forward and back while lying on her left side.  Able to move her left leg forward and back.       Data Reviewed: Basic Metabolic Panel: Recent Labs  Lab 01/23/21 1340 01/24/21 0521 01/25/21 0549 01/26/21 0515  NA 132* 135  --  137  K 4.2 4.4  --  4.4  CL 101 102  --  106  CO2 19* 25  --  26  GLUCOSE 215* 121*  --  115*  BUN 23 24*  --  22  CREATININE 0.99 0.88 1.06* 0.90  CALCIUM 10.2 9.8  --  8.6*   Liver Function Tests: Recent Labs  Lab 01/23/21 1340 01/24/21 0521  AST 70* 49*  ALT 76* 66*  ALKPHOS 88 74  BILITOT 1.0 1.4*  PROT 7.9 7.4  ALBUMIN 4.5 4.1   Recent Labs  Lab 01/23/21 1340  LIPASE 46   CBC: Recent Labs  Lab 01/23/21 1340 01/24/21 0521 01/25/21 0549 01/26/21 0515  WBC 15.8* 11.7* 7.2 7.0  NEUTROABS  --  9.7*  --   --   HGB 12.1 11.8* 10.9* 10.1*  HCT 37.8 36.8 34.3* 31.4*  MCV 82.0 81.6 83.1 82.4  PLT 220 197 168 163   BNP (last 3 results) Recent Labs  01/15/21 1023  BNP 167.7*    CBG: Recent Labs  Lab 01/24/21 0132 01/24/21 0416 01/24/21 0754 01/24/21 1203 01/24/21 1605  GLUCAP 122* 124* 121* 136* 145*    Recent Results (from the past 240 hour(s))  CULTURE, BLOOD (ROUTINE X 2) w Reflex to ID Panel     Status: None (Preliminary result)   Collection Time: 01/23/21 12:04 AM   Specimen: BLOOD  Result Value Ref Range Status   Specimen Description BLOOD  L HAND  Final   Special Requests   Final    BOTTLES DRAWN AEROBIC AND ANAEROBIC Blood Culture adequate volume   Culture   Final    NO GROWTH 2 DAYS Performed at [facility], [address] [place], [place] [address]    Report Status PENDING  Incomplete  Resp Panel by RT-PCR (Flu A&B, Covid) Nasopharyngeal Swab     Status: None   Collection Time: 01/23/21  4:38 PM   Specimen: Nasopharyngeal Swab; Nasopharyngeal(NP) swabs in vial transport medium  Result Value Ref Range Status   SARS Coronavirus 2 by RT PCR NEGATIVE NEGATIVE Final    Comment:  (NOTE) SARS-CoV-2 target nucleic acids are NOT DETECTED.  The SARS-CoV-2 RNA is generally detectable in upper respiratory specimens during the acute phase of infection. The lowest concentration of SARS-CoV-2 viral copies this assay can detect is 138 copies/mL. A negative result does not preclude SARS-Cov-2 infection and should not be used as the sole basis for treatment or other patient management decisions. A negative result may occur with  improper specimen collection/handling, submission of specimen other than nasopharyngeal swab, presence of viral mutation(s) within the areas targeted by this assay, and inadequate number of viral copies(<138 copies/mL). A negative result must be combined with clinical observations, patient history, and epidemiological information. The expected result is Negative.  Fact Sheet for Patients:  EntrepreneurPulse.com.au  Fact Sheet for Healthcare Providers:  IncredibleEmployment.be  This test is no t yet approved or cleared by the [place] FDA and  has been authorized for detection and/or diagnosis of SARS-CoV-2 by FDA under an Emergency Use Authorization (EUA). This EUA will remain  in effect (meaning this test can be used) for the duration of the COVID-19 declaration under Section 564(b)(1) of the Act, 21 U.S.C.section 360bbb-3(b)(1), unless the authorization is terminated  or revoked sooner.       Influenza A by PCR NEGATIVE NEGATIVE Final   Influenza B by PCR NEGATIVE NEGATIVE Final    Comment: (NOTE) The Xpert Xpress SARS-CoV-2/FLU/RSV plus assay is intended as an aid in the diagnosis of influenza from Nasopharyngeal swab specimens and should not be used as a sole basis for treatment. Nasal washings and aspirates are unacceptable for Xpert Xpress SARS-CoV-2/FLU/RSV testing.  Fact Sheet for Patients: EntrepreneurPulse.com.au  Fact Sheet for Healthcare  Providers: IncredibleEmployment.be  This test is not yet approved or cleared by the [place] FDA and has been authorized for detection and/or diagnosis of SARS-CoV-2 by FDA under an Emergency Use Authorization (EUA). This EUA will remain in effect (meaning this test can be used) for the duration of the COVID-19 declaration under Section 564(b)(1) of the Act, 21 U.S.C. section 360bbb-3(b)(1), unless the authorization is terminated or revoked.  Performed at [facility], [address] [place], [place] [address]   MRSA PCR Screening     Status: None   Collection Time: 01/24/21 12:04 AM   Specimen: Nasopharyngeal  Result Value Ref Range Status   MRSA by PCR NEGATIVE NEGATIVE Final  Comment:        The GeneXpert MRSA Assay (FDA approved for NASAL specimens only), is one component of a comprehensive MRSA colonization surveillance program. It is not intended to diagnose MRSA infection nor to guide or monitor treatment for MRSA infections. Performed at [facility], [address] [place], [place] [address]   CULTURE, BLOOD (ROUTINE X 2) w Reflex to ID Panel     Status: None (Preliminary result)   Collection Time: 01/24/21 12:09 AM   Specimen: BLOOD  Result Value Ref Range Status   Specimen Description BLOOD  L ARM  Final   Special Requests   Final    BOTTLES DRAWN AEROBIC AND ANAEROBIC Blood Culture adequate volume   Culture   Final    NO GROWTH 2 DAYS Performed at [facility], [address], [place], [place] [address]    Report Status PENDING  Incomplete  Aerobic/Anaerobic Culture (surgical/deep wound)     Status: None (Preliminary result)   Collection Time: 01/24/21 10:23 AM   Specimen: Wound  Result Value Ref Range Status   Specimen Description   Final    WOUND RIGHT LUMBAR HEMOTOMA Performed at [facility], [place] [address] [place], [place] [address]    Special Requests   Final    NONE Performed at  [facility], [place], [place] [address]    Gram Stain NO WBC SEEN NO ORGANISMS SEEN   Final   Culture   Final    NO GROWTH 2 DAYS NO ANAEROBES ISOLATED; CULTURE IN PROGRESS FOR 5 DAYS Performed at [place] [facility], [place] [address] [place],  [address]    Report Status PENDING  Incomplete      Scheduled Meds: . amLODipine  5 mg Oral Daily  . atorvastatin  40 mg Oral QHS  . fluticasone furoate-vilanterol  1 puff Inhalation Daily   And  . umeclidinium bromide  1 puff Inhalation Daily  . lamoTRIgine  150 mg Oral BID  . metoprolol tartrate  50 mg Oral BID  . pantoprazole  40 mg Oral Daily  . pneumococcal 23 valent vaccine  0.5 mL Intramuscular Tomorrow-1000  . senna-docusate  1 tablet Oral QHS  . warfarin  6 mg Oral ONCE-1600  . Warfarin - Pharmacist Dosing Inpatient   Does not apply q1600   Continuous Infusions: . ampicillin-sulbactam (UNASYN) IV 3 g (01/26/21 0816)  . vancomycin 750 mg (01/25/21 1741)    Assessment/Plan:  1. Hematoma right back with severe back pain.  Cultures showed no growth for 2 days.  Will discontinue antibiotics and observe.  Trying tramadol for pain control.  Case discussed with interventional radiology and they did not recommend doing a drainage procedure of the hematoma secondary to the blood there and a confined space and the blood starting to coagulate.  Continue to observe.  Physical therapy reevaluation. 2. Mechanical heart valve.  Case discussed with Dr. Ubaldo Glassing her cardiologist and recommended keeping INR on the lower side 1.8 to 1.9.  Continue Coumadin.  Case discussed with pharmacist. 3. Essential hypertension on metoprolol and Norvasc. 4. GERD on PPI 5. Seizure disorder.  Continue lamotrigine 6. COPD.  On Breo Ellipta and Incruse Ellipta. 7. Type 2 diabetes mellitus with hyperlipidemia.  Hemoglobin A1c borderline at 6.5.  Continue diet control.  Continue atorvastatin.     Code Status:     Code Status Orders   (From admission, onward)         Start  Ordered   01/24/21 0046  Full code  Continuous        01/24/21 0045        Code Status History    Date Active Date Inactive Code Status Order ID Comments User Context   01/15/2021 1508 01/18/2021 1934 Full Code [id number]  Ivor Costa, MD ED   Advance Care Planning Activity     Family Communication: Spoke with husband on the phone Disposition Plan: Status is: Inpatient  Dispo:  Patient From: Home  Planned Disposition: Home with Health Care Svc.  Patient told transitional care team that she did not want to do rehab at a facility and would like to do it at home.  Medically stable for discharge: No.  Still trying to get pain control on with oral medications.  Would like to have physical therapy reevaluation.   Time spent: 29 minutes  Dora

## 2021-01-27 LAB — CBC
HCT: 30.6 % — ABNORMAL LOW (ref 36.0–46.0)
Hemoglobin: 9.8 g/dL — ABNORMAL LOW (ref 12.0–15.0)
MCH: 26.6 pg (ref 26.0–34.0)
MCHC: 32 g/dL (ref 30.0–36.0)
MCV: 83.2 fL (ref 80.0–100.0)
Platelets: 181 10*3/uL (ref 150–400)
RBC: 3.68 MIL/uL — ABNORMAL LOW (ref 3.87–5.11)
RDW: 13.4 % (ref 11.5–15.5)
WBC: 7.5 10*3/uL (ref 4.0–10.5)
nRBC: 0 % (ref 0.0–0.2)

## 2021-01-27 LAB — PROTIME-INR
INR: 1.7 — ABNORMAL HIGH (ref 0.8–1.2)
Prothrombin Time: 19.4 seconds — ABNORMAL HIGH (ref 11.4–15.2)

## 2021-01-27 LAB — CREATININE, SERUM
Creatinine, Ser: 0.96 mg/dL (ref 0.44–1.00)
GFR, Estimated: >60 mL/min (ref >60)

## 2021-01-27 MED ORDER — WARFARIN SODIUM 6 MG PO TABS
7.0000 mg | ORAL_TABLET | Freq: Once | ORAL | Status: DC
  Filled 2021-01-27: qty 1

## 2021-01-27 MED ORDER — POLYETHYLENE GLYCOL 3350 17 G PO PACK: 17.0000 g | PACK | Freq: Every day | ORAL | 0 refills | Status: DC | PRN

## 2021-01-27 MED ORDER — ACETAMINOPHEN 325 MG PO TABS: 650.0000 mg | ORAL_TABLET | Freq: Four times a day (QID) | ORAL | Status: DC | PRN

## 2021-01-27 MED ORDER — TRAMADOL HCL 50 MG PO TABS: 50.0000 mg | ORAL_TABLET | Freq: Four times a day (QID) | ORAL | 0 refills | Status: AC | PRN

## 2021-01-27 MED ORDER — WARFARIN SODIUM 3 MG PO TABS: 6.0000 mg | ORAL_TABLET | Freq: Every day | ORAL | Status: AC

## 2021-01-27 NOTE — Discharge Instructions (Signed)
Check INR weekly- keep inr on lower side 1.7-1.9.

## 2021-01-27 NOTE — TOC Transition Note (Signed)
**Note De-Identified Faith Branan Obfuscation** Transition of Care [facility]) - CM/SW Discharge Note   Patient Details  Name: Jameela Michna MRN: [id number] Date of Birth: 08-08-1946  Transition of Care [place]) CM/SW Contact:  Candie Chroman, LCSW Phone Number: 01/27/2021, 10:01 AM   Clinical Narrative: Patient has orders to discharge home today. Advanced representative is aware. No further concerns. CSW signing off.    Final next level of care: [place] Barriers to Discharge: Barriers Resolved   Patient Goals and CMS Choice Patient states their goals for this hospitalization and ongoing recovery are:: home with home health CMS Medicare.gov Compare Post Acute Care list provided to:: Patient Choice offered to / list presented to : NA  Discharge Placement                    Patient and family notified of of transfer: 01/27/21  Discharge Plan and Services                          HH Arranged: RN,PT,OT,Social Work [employer] Agency: Microbiologist ([place]) Date [place]: 01/27/21   Representative spoke with at [place]: Floydene Flock  Social Determinants of Health (SDOH) Interventions     Readmission Risk Interventions No flowsheet data found.

## 2021-01-27 NOTE — Progress Notes (Signed)
[place] for Warfarin  Indication: Afib, Mech AVR  No Known Allergies  Patient Measurements: Height: 5' 2.5" (158.8 cm) Weight: 64.7 kg (142 lb 10.2 oz) IBW/kg (Calculated) : 51.25 Heparin Dosing Weight:    Vital Signs: Temp: 98.4 F (36.9 C) (03/22 0829) Temp Source: Oral (03/22 0419) BP: 140/57 (03/22 0829) Pulse Rate: 62 (03/22 0829)  Labs: Recent Labs    01/25/21 0549 01/26/21 0515 01/27/21 0516  HGB 10.9* 10.1* 9.8*  HCT 34.3* 31.4* 30.6*  PLT 168 163 181  LABPROT 19.4* 20.5* 19.4*  INR 1.7* 1.8* 1.7*  CREATININE 1.06* 0.90 0.96    Estimated Creatinine Clearance: 44.6 mL/min (by C-G formula based on SCr of 0.96 mg/dL).   Medical History: Past Medical History:  Diagnosis Date  . CHF (congestive heart failure) ([place])   . Chronic kidney disease    CKD  . COPD (chronic obstructive pulmonary disease) ([place])   . Dysrhythmia    Atrial Fibrillation  . GERD (gastroesophageal reflux disease)   . Hypertension   . Seizures ([place])     Assessment: Patient is 75yo female being admitted with leg/buttock pain and inability to ambulate. Pharmacy consulted to resume Coumadin when okay with IR. Patient is tentatively planned for IR procedure on Saturday. S/p CT guided aspiration R paraspinal collection hematoma, bleeding risk associated with procedure: low. No major DDI with warfarin, but started on unasyn + vancomycin 3/19.   INR on admission 1.8 Home dose Coumadin 7 mg daily  Date INR Warfarin Dose  3/19 1.6 7 mg  3/20 1.7 7 mg  3/21 1.8 6 mg  3/22 1.7    DDI: none currently  Goal of Therapy:  Goal INR 2.5 - 3.5 for history of MAVR and Afib **3/21: GOAL INR 1.8-1.9 now with paraspinal hematoma per Cardiology recommendation-see MD progress note 01/26/21**  Plan:  INR subtherapeutic but starting to trend up. Will give warfarin 7 mg x 1 tonight (home dose).  -Holding off on Lovenox injections or heparin drip secondary to  hematoma of the back per MD note. Daily INR ordered. CBC at least every 3 days.   Chinita Greenland PharmD Clinical Pharmacist 01/27/2021

## 2021-01-27 NOTE — Plan of Care (Signed)
Reviewed POC with patient.  All questions answered and understanding verbalized.

## 2021-01-27 NOTE — Plan of Care (Signed)
  Problem: Education: Goal: Ability to verbalize activity precautions or restrictions will improve Outcome: Adequate for Discharge Goal: Knowledge of the prescribed therapeutic regimen will improve Outcome: Adequate for Discharge Goal: Understanding of discharge needs will improve Outcome: Adequate for Discharge   Problem: Activity: Goal: Ability to avoid complications of mobility impairment will improve Outcome: Adequate for Discharge Goal: Ability to tolerate increased activity will improve Outcome: Adequate for Discharge Goal: Will remain free from falls Outcome: Adequate for Discharge   Problem: Bowel/Gastric: Goal: Gastrointestinal status for postoperative course will improve Outcome: Adequate for Discharge   Problem: Clinical Measurements: Goal: Ability to maintain clinical measurements within normal limits will improve Outcome: Adequate for Discharge Goal: Postoperative complications will be avoided or minimized Outcome: Adequate for Discharge Goal: Diagnostic test results will improve Outcome: Adequate for Discharge   Problem: Pain Management: Goal: Pain level will decrease Outcome: Adequate for Discharge   Problem: Skin Integrity: Goal: Will show signs of wound healing Outcome: Adequate for Discharge   Problem: Health Behavior/Discharge Planning: Goal: Identification of resources available to assist in meeting health care needs will improve Outcome: Adequate for Discharge

## 2021-01-27 NOTE — Discharge Summary (Signed)
[place] [facility] [place] NAME: Janice Tyler    MR#:  [id number]  DATE OF BIRTH:  Jul 31, 1946  DATE OF ADMISSION:  01/23/2021 ADMITTING PHYSICIAN: Kayleen Memos, DO  DATE OF DISCHARGE: 01/27/2021 12:05 PM  PRIMARY CARE PHYSICIAN: Kirk Ruths, MD    ADMISSION DIAGNOSIS:  Back pain [M54.9] Paraspinal mass [R22.2]  DISCHARGE DIAGNOSIS:  Active Problems:   Type 2 diabetes mellitus with hyperlipidemia (HCC)   Essential hypertension   Back pain   Paraspinal mass   Hematoma   SECONDARY DIAGNOSIS:   Past Medical History:  Diagnosis Date  . CHF (congestive heart failure) ([place])   . Chronic kidney disease    CKD  . COPD (chronic obstructive pulmonary disease) ([place])   . Dysrhythmia    Atrial Fibrillation  . GERD (gastroesophageal reflux disease)   . Hypertension   . Seizures ([place])     HOSPITAL COURSE:   1.  Hematoma right back with severe back pain.  Culture showed no growth and antibiotics were stopped after that.  She did have problems tolerating codeine products for pain medication.  She did better with the tramadol.  Interventional radiology initially did a sample and drew off 5 mL and when I called them back on whether or not they would drain more from the hematoma they said the blood is coagulated in its confined space and would recommend not driving off more blood and have it reabsorb over time.  Physical therapy recommended rehab but patient would like to go home.  Home health set up.  Patient's husband bought her scooter.  Advised to avoid any further back procedures.  MRI did show a right paraspinal musculature hematoma measuring 4.1 x 3.9 x 13 cm. 2.  Mechanical heart valve.  Case discussed with Dr. Ubaldo Glassing her cardiologist and recommended keeping INR on the lower side between 1 7 and 1 9.  Continue Coumadin 6 mg daily with weekly INR draws. 3.  Essential hypertension on metoprolol and Norvasc 4.  GERD on PPI 5.  Seizure disorder  on lamotrigine 6.  COPD on Trelegy inhaler 7.  Type 2 diabetes mellitus with hyperlipidemia.  Hemoglobin A1c borderline at 6.5.  I spoke with the patient during the hospital course about diet control for diabetes.  Continue atorvastatin.  DISCHARGE CONDITIONS:   Satisfactory  CONSULTS OBTAINED:  Interventional radiology  DRUG ALLERGIES:  No Known Allergies  DISCHARGE MEDICATIONS:   Allergies as of 01/27/2021   No Known Allergies     Medication List    STOP taking these medications   aspirin EC 81 MG tablet   enoxaparin 80 MG/0.8ML injection Commonly known as: LOVENOX   HYDROcodone-acetaminophen 5-325 MG tablet Commonly known as: NORCO/VICODIN   predniSONE 5 MG (48) Tbpk tablet Commonly known as: STERAPRED UNI-PAK 48 TAB     TAKE these medications   acetaminophen 325 MG tablet Commonly known as: TYLENOL Take 2 tablets (650 mg total) by mouth every 6 (six) hours as needed. What changed:   medication strength  how much to take Notes to patient: Not given while in hospital    albuterol 108 (90 Base) MCG/ACT inhaler Commonly known as: VENTOLIN HFA Inhale 1 puff into the lungs every 6 (six) hours as needed for wheezing or shortness of breath. Notes to patient: Not given while in hospital    amLODipine 5 MG tablet Commonly known as: NORVASC Take 5 mg by mouth daily. Notes to patient: Last dose given 01/26/21 9:34am  atorvastatin 40 MG tablet Commonly known as: LIPITOR Take 40 mg by mouth at bedtime. Notes to patient: Last dose given 01/26/21 8:49pm   Cholecalciferol 50 MCG (2000 UT) Tabs Take 2,000 Units by mouth daily. Notes to patient: Not given while in hospital    fluocinolone 0.01 % external solution Commonly known as: SYNALAR Apply 1 application topically 2 (two) times daily. Notes to patient: Not given while in hospital    furosemide 40 MG tablet Commonly known as: LASIX Take 40 mg by mouth daily. Notes to patient: Not given while in hospital     ketoconazole 2 % shampoo Commonly known as: NIZORAL Apply 1 application topically 2 (two) times a week. Notes to patient: Not given while in hospital    lamoTRIgine 150 MG tablet Commonly known as: LAMICTAL Take 150 mg by mouth 2 (two) times daily. Notes to patient: Last dose given 01/27/21 9:32am   metoprolol tartrate 50 MG tablet Commonly known as: LOPRESSOR Take 50 mg by mouth 2 (two) times daily. Notes to patient: Last dose given 01/27/21 9:35am   pantoprazole 40 MG tablet Commonly known as: PROTONIX Take 40 mg by mouth daily. Notes to patient: Last dose given 01/27/21 9:35am   polyethylene glycol 17 g packet Commonly known as: MIRALAX / GLYCOLAX Take 17 g by mouth daily as needed. Notes to patient: Last dose given 9:35am 01/27/21   traMADol 50 MG tablet Commonly known as: ULTRAM Take 1 tablet (50 mg total) by mouth every 6 (six) hours as needed for moderate pain. Notes to patient: Last dose given 01/27/21 9:33am   Trelegy Ellipta 100-62.5-25 MCG/INH Aepb Generic drug: Fluticasone-Umeclidin-Vilant Inhale 1 puff into the lungs daily. Notes to patient: Not given while in hospital    Vitamin B12 1000 MCG Tbcr Take 1,000 mcg by mouth daily. Notes to patient: Not given while in hospital    warfarin 3 MG tablet Commonly known as: COUMADIN Take 2 tablets (6 mg total) by mouth daily. What changed:   additional instructions  Another medication with the same name was removed. Continue taking this medication, and follow the directions you see here. Notes to patient: Last dose given 01/26/21 7:08pm        DISCHARGE INSTRUCTIONS:  Follow-up PMD 5 days Home health INR lab draw weekly  If you experience worsening of your admission symptoms, develop shortness of breath, life threatening emergency, suicidal or homicidal thoughts you must seek medical attention immediately by calling 911 or calling your MD immediately  if symptoms less severe.  You Must read complete  instructions/literature along with all the possible adverse reactions/side effects for all the Medicines you take and that have been prescribed to you. Take any new Medicines after you have completely understood and accept all the possible adverse reactions/side effects.   Please note  You were cared for by a hospitalist during your hospital stay. If you have any questions about your discharge medications or the care you received while you were in the hospital after you are discharged, you can call the unit and asked to speak with the hospitalist on call if the hospitalist that took care of you is not available. Once you are discharged, your primary care physician will handle any further medical issues. Please note that NO REFILLS for any discharge medications will be authorized once you are discharged, as it is imperative that you return to your primary care physician (or establish a relationship with a primary care physician if you do not have one) for your  aftercare needs so that they can reassess your need for medications and monitor your lab values.    Today   CHIEF COMPLAINT:   Back pain  HISTORY OF PRESENT ILLNESS:  Janice Tyler  is a 75 y.o. female family with back pain   VITAL SIGNS:  Blood pressure (!) 112/55, pulse (!) 57, temperature 98.6 F (37 C), resp. rate 15, height 5' 2.5" (1.588 m), weight 64.7 kg, SpO2 98 %.  I/O:    Intake/Output Summary (Last 24 hours) at 01/27/2021 1806 Last data filed at 01/27/2021 1023 Gross per 24 hour  Intake 120 ml  Output 700 ml  Net -580 ml    PHYSICAL EXAMINATION:  GENERAL:  75 y.o.-year-old patient lying in the bed with no acute distress.  EYES: Pupils equal, round, reactive to light and accommodation. No scleral icterus. HEENT: Head atraumatic, normocephalic. Oropharynx and nasopharynx clear.   LUNGS: Normal breath sounds bilaterally, no wheezing, rales,rhonchi or crepitation. No use of accessory muscles of respiration.   CARDIOVASCULAR: S1, S2 normal. No murmurs, rubs, or gallops.  ABDOMEN: Soft, non-tender, non-distended. Bowel sounds present. No organomegaly or mass.  EXTREMITIES: No pedal edema.  NEUROLOGIC: Cranial nerves II through XII are intact. Muscle strength 5/5 in all extremities. Sensation intact. Gait not checked.  PSYCHIATRIC: The patient is alert and oriented x 3.  SKIN: No obvious rash, lesion, or ulcer.   DATA REVIEW:   CBC Recent Labs  Lab 01/27/21 0516  WBC 7.5  HGB 9.8*  HCT 30.6*  PLT 181    Chemistries  Recent Labs  Lab 01/24/21 0521 01/25/21 0549 01/26/21 0515 01/27/21 0516  NA 135  --  137  --   K 4.4  --  4.4  --   CL 102  --  106  --   CO2 25  --  26  --   GLUCOSE 121*  --  115*  --   BUN 24*  --  22  --   CREATININE 0.88   < > 0.90 0.96  CALCIUM 9.8  --  8.6*  --   AST 49*  --   --   --   ALT 66*  --   --   --   ALKPHOS 74  --   --   --   BILITOT 1.4*  --   --   --    < > = values in this interval not displayed.     Microbiology Results  Results for orders placed or performed during the hospital encounter of 01/23/21  CULTURE, BLOOD (ROUTINE X 2) w Reflex to ID Panel     Status: None (Preliminary result)   Collection Time: 01/23/21 12:04 AM   Specimen: BLOOD  Result Value Ref Range Status   Specimen Description BLOOD  L HAND  Final   Special Requests   Final    BOTTLES DRAWN AEROBIC AND ANAEROBIC Blood Culture adequate volume   Culture   Final    NO GROWTH 3 DAYS Performed at [facility], [address], [place], [place] [address]    Report Status PENDING  Incomplete  Resp Panel by RT-PCR (Flu A&B, Covid) Nasopharyngeal Swab     Status: None   Collection Time: 01/23/21  4:38 PM   Specimen: Nasopharyngeal Swab; Nasopharyngeal(NP) swabs in vial transport medium  Result Value Ref Range Status   SARS Coronavirus 2 by RT PCR NEGATIVE NEGATIVE Final    Comment: (NOTE) SARS-CoV-2 target nucleic acids are NOT DETECTED.  The  SARS-CoV-2 RNA  is generally detectable in upper respiratory specimens during the acute phase of infection. The lowest concentration of SARS-CoV-2 viral copies this assay can detect is 138 copies/mL. A negative result does not preclude SARS-Cov-2 infection and should not be used as the sole basis for treatment or other patient management decisions. A negative result may occur with  improper specimen collection/handling, submission of specimen other than nasopharyngeal swab, presence of viral mutation(s) within the areas targeted by this assay, and inadequate number of viral copies(<138 copies/mL). A negative result must be combined with clinical observations, patient history, and epidemiological information. The expected result is Negative.  Fact Sheet for Patients:  EntrepreneurPulse.com.au  Fact Sheet for Healthcare Providers:  IncredibleEmployment.be  This test is no t yet approved or cleared by the [place] FDA and  has been authorized for detection and/or diagnosis of SARS-CoV-2 by FDA under an Emergency Use Authorization (EUA). This EUA will remain  in effect (meaning this test can be used) for the duration of the COVID-19 declaration under Section 564(b)(1) of the Act, 21 U.S.C.section 360bbb-3(b)(1), unless the authorization is terminated  or revoked sooner.       Influenza A by PCR NEGATIVE NEGATIVE Final   Influenza B by PCR NEGATIVE NEGATIVE Final    Comment: (NOTE) The Xpert Xpress SARS-CoV-2/FLU/RSV plus assay is intended as an aid in the diagnosis of influenza from Nasopharyngeal swab specimens and should not be used as a sole basis for treatment. Nasal washings and aspirates are unacceptable for Xpert Xpress SARS-CoV-2/FLU/RSV testing.  Fact Sheet for Patients: EntrepreneurPulse.com.au  Fact Sheet for Healthcare Providers: IncredibleEmployment.be  This test is not yet approved or cleared by the  [place] FDA and has been authorized for detection and/or diagnosis of SARS-CoV-2 by FDA under an Emergency Use Authorization (EUA). This EUA will remain in effect (meaning this test can be used) for the duration of the COVID-19 declaration under Section 564(b)(1) of the Act, 21 U.S.C. section 360bbb-3(b)(1), unless the authorization is terminated or revoked.  Performed at [facility], [address] [place], [place] [address]   MRSA PCR Screening     Status: None   Collection Time: 01/24/21 12:04 AM   Specimen: Nasopharyngeal  Result Value Ref Range Status   MRSA by PCR NEGATIVE NEGATIVE Final    Comment:        The GeneXpert MRSA Assay (FDA approved for NASAL specimens only), is one component of a comprehensive MRSA colonization surveillance program. It is not intended to diagnose MRSA infection nor to guide or monitor treatment for MRSA infections. Performed at [facility], [address] [place], [place] [address]   CULTURE, BLOOD (ROUTINE X 2) w Reflex to ID Panel     Status: None (Preliminary result)   Collection Time: 01/24/21 12:09 AM   Specimen: BLOOD  Result Value Ref Range Status   Specimen Description BLOOD  L ARM  Final   Special Requests   Final    BOTTLES DRAWN AEROBIC AND ANAEROBIC Blood Culture adequate volume   Culture   Final    NO GROWTH 3 DAYS Performed at [facility], [address], [place], [place] [address]    Report Status PENDING  Incomplete  Aerobic/Anaerobic Culture (surgical/deep wound)     Status: None (Preliminary result)   Collection Time: 01/24/21 10:23 AM   Specimen: Wound  Result Value Ref Range Status   Specimen Description   Final    WOUND RIGHT LUMBAR HEMOTOMA Performed at  [facility], [place] [address] [place], [place] [address]    Special Requests   Final    NONE Performed at [facility], [place], [place] [address]    Gram Stain NO WBC SEEN NO ORGANISMS  SEEN   Final   Culture   Final    NO GROWTH 3 DAYS NO ANAEROBES ISOLATED; CULTURE IN PROGRESS FOR 5 DAYS Performed at [facility], [address] [place], [place] [address]    Report Status PENDING  Incomplete      Management plans discussed with the patient, and she is in agreement.  Spoke with husband last night about the plan  CODE STATUS:  Code Status History    Date Active Date Inactive Code Status Order ID Comments User Context   01/24/2021 0045 01/27/2021 1705 Full Code [id number]  Kayleen Memos, DO ED   01/15/2021 1508 01/18/2021 1934 Full Code [id number]  Ivor Costa, MD ED   Advance Care Planning Activity    Questions for Most Recent Historical Code Status (Order [id number])       TOTAL TIME TAKING CARE OF THIS PATIENT: 35 minutes.    Loletha Grayer M.D on 01/27/2021 at 6:06 PM  Between 7am to 6pm - Pager - [phone]  After 6pm go to [email or web address] - password [email or web address]  [facility]  CC: Primary care physician; Kirk Ruths, MD

## 2021-01-27 NOTE — Progress Notes (Signed)
DC order noted.  Reviewed discharge instructions with patient including follow up appointment with provider, medication, prescriptions, activity, wound care and when to notify physician.  Questions answered and understanding verbalized.  Copy given.  Patient discharged off unit at this time stable without complaint.

## 2021-01-29 LAB — CULTURE, BLOOD (ROUTINE X 2)
Culture: NO GROWTH
Culture: NO GROWTH
Special Requests: ADEQUATE
Special Requests: ADEQUATE

## 2021-01-29 LAB — AEROBIC/ANAEROBIC CULTURE W GRAM STAIN (SURGICAL/DEEP WOUND)
Culture: NO GROWTH
Gram Stain: NONE SEEN

## 2021-04-07 ENCOUNTER — Other Ambulatory Visit: Payer: Self-pay | Admitting: Internal Medicine

## 2021-04-07 DIAGNOSIS — Z1231 Encounter for screening mammogram for malignant neoplasm of breast: Secondary | ICD-10-CM

## 2021-04-08 ENCOUNTER — Other Ambulatory Visit: Payer: Self-pay | Admitting: *Deleted

## 2021-04-08 DIAGNOSIS — Z87891 Personal history of nicotine dependence: Secondary | ICD-10-CM

## 2021-04-08 DIAGNOSIS — Z122 Encounter for screening for malignant neoplasm of respiratory organs: Secondary | ICD-10-CM

## 2021-04-08 NOTE — Progress Notes (Signed)
Contacted and scheduled for annual lung screening scan. Patient is a former smoker, quit 2015 with a 42 pack year history.

## 2021-04-22 ENCOUNTER — Other Ambulatory Visit: Payer: Self-pay

## 2021-04-22 ENCOUNTER — Ambulatory Visit
Admission: RE | Admit: 2021-04-22 | Discharge: 2021-04-22 | Disposition: A | Discharge status: Home / Self Care | Payer: Federal, State, Local not specified - PPO | Source: Ambulatory Visit | Attending: Internal Medicine | Admitting: Internal Medicine

## 2021-04-22 DIAGNOSIS — Z1231 Encounter for screening mammogram for malignant neoplasm of breast: Secondary | ICD-10-CM | POA: Diagnosis not present

## 2021-04-30 ENCOUNTER — Other Ambulatory Visit: Payer: Self-pay

## 2021-04-30 ENCOUNTER — Ambulatory Visit
Admission: RE | Admit: 2021-04-30 | Discharge: 2021-04-30 | Disposition: A | Discharge status: Home / Self Care | Payer: Federal, State, Local not specified - PPO | Source: Ambulatory Visit | Attending: Nurse Practitioner | Admitting: Nurse Practitioner

## 2021-04-30 DIAGNOSIS — Z122 Encounter for screening for malignant neoplasm of respiratory organs: Secondary | ICD-10-CM | POA: Insufficient documentation

## 2021-04-30 DIAGNOSIS — Z87891 Personal history of nicotine dependence: Secondary | ICD-10-CM | POA: Insufficient documentation

## 2021-05-21 ENCOUNTER — Encounter: Payer: Self-pay | Admitting: *Deleted

## 2021-08-21 ENCOUNTER — Encounter
Admission: RE | Disposition: A | Discharge status: Home / Self Care | Payer: Self-pay | Source: Home / Self Care | Attending: Cardiology

## 2021-08-21 ENCOUNTER — Encounter: Payer: Self-pay | Admitting: Cardiology

## 2021-08-21 ENCOUNTER — Ambulatory Visit
Admission: RE | Admit: 2021-08-21 | Discharge: 2021-08-21 | Disposition: A | Discharge status: Home / Self Care | Payer: Federal, State, Local not specified - PPO | Attending: Cardiology | Admitting: Cardiology

## 2021-08-21 ENCOUNTER — Other Ambulatory Visit: Payer: Self-pay

## 2021-08-21 DIAGNOSIS — Z7901 Long term (current) use of anticoagulants: Secondary | ICD-10-CM | POA: Insufficient documentation

## 2021-08-21 DIAGNOSIS — Z87891 Personal history of nicotine dependence: Secondary | ICD-10-CM | POA: Diagnosis not present

## 2021-08-21 DIAGNOSIS — I480 Paroxysmal atrial fibrillation: Secondary | ICD-10-CM | POA: Diagnosis not present

## 2021-08-21 DIAGNOSIS — R9439 Abnormal result of other cardiovascular function study: Secondary | ICD-10-CM

## 2021-08-21 DIAGNOSIS — I130 Hypertensive heart and chronic kidney disease with heart failure and stage 1 through stage 4 chronic kidney disease, or unspecified chronic kidney disease: Secondary | ICD-10-CM | POA: Insufficient documentation

## 2021-08-21 DIAGNOSIS — Z7982 Long term (current) use of aspirin: Secondary | ICD-10-CM | POA: Insufficient documentation

## 2021-08-21 DIAGNOSIS — N189 Chronic kidney disease, unspecified: Secondary | ICD-10-CM | POA: Insufficient documentation

## 2021-08-21 DIAGNOSIS — I2510 Atherosclerotic heart disease of native coronary artery without angina pectoris: Secondary | ICD-10-CM | POA: Diagnosis not present

## 2021-08-21 DIAGNOSIS — E785 Hyperlipidemia, unspecified: Secondary | ICD-10-CM | POA: Insufficient documentation

## 2021-08-21 DIAGNOSIS — R0609 Other forms of dyspnea: Secondary | ICD-10-CM | POA: Diagnosis not present

## 2021-08-21 DIAGNOSIS — Z952 Presence of prosthetic heart valve: Secondary | ICD-10-CM | POA: Insufficient documentation

## 2021-08-21 DIAGNOSIS — Z79899 Other long term (current) drug therapy: Secondary | ICD-10-CM | POA: Diagnosis not present

## 2021-08-21 DIAGNOSIS — E1122 Type 2 diabetes mellitus with diabetic chronic kidney disease: Secondary | ICD-10-CM | POA: Insufficient documentation

## 2021-08-21 HISTORY — PX: RIGHT/LEFT HEART CATH AND CORONARY ANGIOGRAPHY: CATH118266

## 2021-08-21 LAB — PROTIME-INR
INR: 1.1 (ref 0.8–1.2)
Prothrombin Time: 14 seconds (ref 11.4–15.2)

## 2021-08-21 SURGERY — RIGHT/LEFT HEART CATH AND CORONARY ANGIOGRAPHY
Anesthesia: Moderate Sedation

## 2021-08-21 MED ORDER — SODIUM CHLORIDE 0.9 % IV SOLN: 250.0000 mL | INTRAVENOUS | Status: DC | PRN

## 2021-08-21 MED ORDER — HEPARIN SODIUM (PORCINE) 1000 UNIT/ML IJ SOLN
INTRAMUSCULAR | Status: AC
  Filled 2021-08-21: qty 1

## 2021-08-21 MED ORDER — MIDAZOLAM HCL 2 MG/2ML IJ SOLN
INTRAMUSCULAR | Status: DC | PRN
  Administered 2021-08-21: 1 mg via INTRAVENOUS

## 2021-08-21 MED ORDER — SODIUM CHLORIDE 0.9% FLUSH: 3.0000 mL | Freq: Two times a day (BID) | INTRAVENOUS | Status: DC

## 2021-08-21 MED ORDER — ACETAMINOPHEN 325 MG PO TABS: 650.0000 mg | ORAL_TABLET | ORAL | Status: DC | PRN

## 2021-08-21 MED ORDER — HEPARIN SODIUM (PORCINE) 1000 UNIT/ML IJ SOLN
INTRAMUSCULAR | Status: DC | PRN
  Administered 2021-08-21: 3500 [IU] via INTRAVENOUS

## 2021-08-21 MED ORDER — ASPIRIN 81 MG PO CHEW
324.0000 mg | CHEWABLE_TABLET | ORAL | Status: AC
  Administered 2021-08-21: 324 mg via ORAL

## 2021-08-21 MED ORDER — IOHEXOL 350 MG/ML SOLN
INTRAVENOUS | Status: DC | PRN
  Administered 2021-08-21: 50 mL

## 2021-08-21 MED ORDER — HEPARIN (PORCINE) IN NACL 1000-0.9 UT/500ML-% IV SOLN
INTRAVENOUS | Status: DC | PRN
  Administered 2021-08-21 (×2): 500 mL

## 2021-08-21 MED ORDER — LIDOCAINE HCL (PF) 1 % IJ SOLN
INTRAMUSCULAR | Status: DC | PRN
  Administered 2021-08-21 (×2): 2 mL

## 2021-08-21 MED ORDER — FENTANYL CITRATE (PF) 100 MCG/2ML IJ SOLN
INTRAMUSCULAR | Status: DC | PRN
  Administered 2021-08-21: 25 ug via INTRAVENOUS

## 2021-08-21 MED ORDER — SODIUM CHLORIDE 0.9 % IV SOLN: INTRAVENOUS | Status: DC

## 2021-08-21 MED ORDER — MIDAZOLAM HCL 2 MG/2ML IJ SOLN
INTRAMUSCULAR | Status: AC
  Filled 2021-08-21: qty 2

## 2021-08-21 MED ORDER — HEPARIN (PORCINE) IN NACL 1000-0.9 UT/500ML-% IV SOLN
INTRAVENOUS | Status: AC
  Filled 2021-08-21: qty 1000

## 2021-08-21 MED ORDER — ONDANSETRON HCL 4 MG/2ML IJ SOLN: 4.0000 mg | Freq: Four times a day (QID) | INTRAMUSCULAR | Status: DC | PRN

## 2021-08-21 MED ORDER — VERAPAMIL HCL 2.5 MG/ML IV SOLN
INTRAVENOUS | Status: AC
  Filled 2021-08-21: qty 2

## 2021-08-21 MED ORDER — ASPIRIN 81 MG PO CHEW
CHEWABLE_TABLET | ORAL | Status: AC
  Filled 2021-08-21: qty 4

## 2021-08-21 MED ORDER — FENTANYL CITRATE (PF) 100 MCG/2ML IJ SOLN
INTRAMUSCULAR | Status: AC
  Filled 2021-08-21: qty 2

## 2021-08-21 MED ORDER — LIDOCAINE HCL 1 % IJ SOLN
INTRAMUSCULAR | Status: AC
  Filled 2021-08-21: qty 20

## 2021-08-21 MED ORDER — SODIUM CHLORIDE 0.9% FLUSH: 3.0000 mL | INTRAVENOUS | Status: DC | PRN

## 2021-08-21 MED ORDER — VERAPAMIL HCL 2.5 MG/ML IV SOLN
INTRAVENOUS | Status: DC | PRN
  Administered 2021-08-21: 2.5 mg via INTRA_ARTERIAL

## 2021-08-21 SURGICAL SUPPLY — 15 items
CATH BALLN WEDGE 5F 110CM (CATHETERS) ×3 IMPLANT
CATH INFINITI 5 FR JL3.5 (CATHETERS) ×3 IMPLANT
CATH INFINITI JR4 5F (CATHETERS) ×3 IMPLANT
CATH LAUNCHER 5F EBU3.0 (CATHETERS) ×2 IMPLANT
CATHETER LAUNCHER 5F EBU3.0 (CATHETERS) ×3
DEVICE RAD TR BAND REGULAR (VASCULAR PRODUCTS) ×3 IMPLANT
DRAPE BRACHIAL (DRAPES) ×6 IMPLANT
GLIDESHEATH SLEND SS 6F .021 (SHEATH) ×3 IMPLANT
GUIDEWIRE INQWIRE 1.5J.035X260 (WIRE) ×2 IMPLANT
INQWIRE 1.5J .035X260CM (WIRE) ×3
PACK CARDIAC CATH (CUSTOM PROCEDURE TRAY) ×3 IMPLANT
PROTECTION STATION PRESSURIZED (MISCELLANEOUS) ×3
SET ATX SIMPLICITY (MISCELLANEOUS) ×3 IMPLANT
SHEATH GLIDE SLENDER 4/5FR (SHEATH) ×3 IMPLANT
STATION PROTECTION PRESSURIZED (MISCELLANEOUS) ×2 IMPLANT

## 2021-08-21 NOTE — H&P (Signed)
[name] Cardiology History and Physical  Patient ID: Janice Tyler MRN: [id number] DOB/AGE: 06/09/1946 75 y.o. Admit date: 08/21/2021  Primary Care Physician: Kirk Ruths, MD Primary Cardiologist Jordan Hawks, MD  HPI:  Janice Tyler is a 75 yo F with h/o mechanical AVR ([employer]) 2002 on coumadin, pAF, HTN, HLD, prior stroke, CKD, T2DM who has been experiencing shortness of breath with exertion recently. Nuclear medicine stress test showed normal EF and homogenous tracer uptake. Echo completed in 04/2021 showed normal RV/LV function. Valve peak gradient 77 mmHg with mean gradient 33 mmhg (increased slightly from 2019).  She is referred for right and left heart catheterization for further evaluation.     Past Medical History:  Diagnosis Date   CHF (congestive heart failure) (HCC)    Chronic kidney disease    CKD   COPD (chronic obstructive pulmonary disease) (HCC)    Dysrhythmia    Atrial Fibrillation   GERD (gastroesophageal reflux disease)    Hypertension    Seizures ([place])     Past Surgical History:  Procedure Laterality Date   ABDOMINAL HYSTERECTOMY     AORTIC VALVE REPLACEMENT     CARDIAC VALVE REPLACEMENT     COLONOSCOPY WITH PROPOFOL N/A 06/18/2019   Procedure: COLONOSCOPY WITH PROPOFOL;  Surgeon: Toledo, Benay Pike, MD;  Location: [facility] ENDOSCOPY;  Service: Gastroenterology;  Laterality: N/A;   OOPHORECTOMY     REPLACEMENT TOTAL KNEE Bilateral    TOTAL HIP ARTHROPLASTY Bilateral    TUBAL LIGATION      Medications Prior to Admission  Medication Sig Dispense Refill Last Dose   albuterol (VENTOLIN HFA) 108 (90 Base) MCG/ACT inhaler Inhale 1 puff into the lungs every 6 (six) hours as needed for wheezing or shortness of breath.   08/20/2021   amLODipine (NORVASC) 5 MG tablet Take 5 mg by mouth daily.   08/20/2021   aspirin EC 81 MG tablet Take 81 mg by mouth in the morning. Swallow whole.   08/20/2021   atorvastatin (LIPITOR) 40 MG tablet Take 40 mg by mouth at bedtime.    08/20/2021   Cholecalciferol (VITAMIN D3) 250 MCG (10000 UT) TABS Take 10,000 Units by mouth in the morning.   08/20/2021   clobetasol (OLUX) 0.05 % topical foam Apply 1 application topically daily.   Past Week   Cyanocobalamin (VITAMIN B-12) 5000 MCG SUBL Take 5,000 mcg by mouth in the morning.   08/20/2021   enoxaparin (LOVENOX) 80 MG/0.8ML injection Inject 70 mg into the skin every 12 (twelve) hours.   08/20/2021   fluocinolone (SYNALAR) 0.01 % external solution Apply 1 application topically 2 (two) times a week.   Past Week   furosemide (LASIX) 40 MG tablet Take 40 mg by mouth daily.   08/20/2021   ketoconazole (NIZORAL) 2 % shampoo Apply 1 application topically 2 (two) times a week.   Past Week   lamoTRIgine (LAMICTAL) 150 MG tablet Take 150 mg by mouth 2 (two) times daily.   08/20/2021   metoprolol tartrate (LOPRESSOR) 50 MG tablet Take 50 mg by mouth 2 (two) times daily.   08/20/2021   Nutritional Supplements (MENOPAUSE FORMULA PO) Take 2 tablets by mouth in the morning. Amberen Menopause Relief   08/20/2021   pantoprazole (PROTONIX) 40 MG tablet Take 40 mg by mouth in the morning.   08/20/2021   acetaminophen (TYLENOL) 500 MG tablet Take 1,000 mg by mouth every 6 (six) hours as needed (for pain.). (Patient not taking: Reported on 08/21/2021)   Not Taking  amoxicillin (AMOXIL) 500 MG capsule Take 2,000 mg by mouth See admin instructions. Take 4 capsules (2000 mg) by mouth 1 hour prior to dental appointments (Patient not taking: Reported on 08/21/2021)   Not Taking   traMADol (ULTRAM) 50 MG tablet Take 1 tablet (50 mg total) by mouth every 6 (six) hours as needed for moderate pain. (Patient not taking: Reported on 08/21/2021) 12 tablet 0 Not Taking   warfarin (COUMADIN) 1 MG tablet Take 0.5 mg by mouth at bedtime. 0.5 mg + 6 mg=6.5 mg nightly   08/15/2021   warfarin (COUMADIN) 3 MG tablet Take 2 tablets (6 mg total) by mouth daily. (Patient taking differently: Take 6 mg by mouth at bedtime. 6  mg + 0.5 mg=6.5 mg nightly)   08/15/2021   Social History   Socioeconomic History   Marital status: Significant Other    Spouse name: Engineer, manufacturing   Number of children: 3   Years of education: Not on file   Highest education level: Not on file  Occupational History   Not on file  Tobacco Use   Smoking status: Former    Packs/day: 1.00    Years: 42.00    Pack years: 42.00    Types: Cigarettes    Quit date: 2015    Years since quitting: 7.7   Smokeless tobacco: Never  Substance and Sexual Activity   Alcohol use: Never   Drug use: Never   Sexual activity: Not on file  Other Topics Concern   Not on file  Social History Narrative   Lives with Proofreader (S.O)   Social Determinants of Health   Financial Resource Strain: Not on file  Food Insecurity: Not on file  Transportation Needs: Not on file  Physical Activity: Not on file  Stress: Not on file  Social Connections: Not on file  Intimate Partner Violence: Not on file    Family History  Problem Relation Age of Onset   Breast cancer Neg Hx       Review of systems complete and found to be negative unless listed above    Physical Exam:  General: Well developed, well nourished, in no acute distress HEENT:  Normocephalic and atramatic Neck:  No JVD.  Lungs: Clear bilaterally to auscultation and percussion. Heart: HRRR; mechanical click present.  Abdomen: Bowel sounds are positive, abdomen soft and non-tender  Msk:  Back normal, normal gait. Normal strength and tone for age. Extremities: No clubbing, cyanosis or edema.   Neuro: Alert and oriented X 3. Psych:  Good affect, responds appropriately   Labs:   Lab Results  Component Value Date   WBC 7.5 01/27/2021   HGB 9.8 (L) 01/27/2021   HCT 30.6 (L) 01/27/2021   MCV 83.2 01/27/2021   PLT 181 01/27/2021   No results for input(s): NA, K, CL, CO2, BUN, CREATININE, CALCIUM, PROT, BILITOT, ALKPHOS, ALT, AST, GLUCOSE in the last 168 hours.  Invalid input(s):  LABALBU No results found for: CKTOTAL, CKMB, CKMBINDEX, TROPONINI  Lab Results  Component Value Date   CHOL 148 01/16/2021   Lab Results  Component Value Date   HDL 76 01/16/2021   Lab Results  Component Value Date   LDLCALC 62 01/16/2021   Lab Results  Component Value Date   TRIG 51 01/16/2021   Lab Results  Component Value Date   CHOLHDL 1.9 01/16/2021   No results found for: LDLDIRECT    Radiology: No results found.  EKG: 01/2021- NSR. Normal EKG  ASSESSMENT AND PLAN:  75 year old female with history of mechanical aortic valve replacement in 2002 who was been experiencing worsening shortness of breath with exertion over the last few months.  She had a negative nuclear medicine stress test.  Her echocardiogram suggest normal LV function but elevated gradients across her aortic valve compared to prior.  She is referred for left and right heart catheterization for further evaluation.  The risks and benefits were discussed at length with the patient and she is in agreement to proceed.  She has been off her Coumadin since Sunday.  INR this morning is 1.1.  She was bridged with Lovenox which she last took last night.  Signed: Andrez Grime MD 08/21/2021, 7:36 AM

## 2022-04-14 ENCOUNTER — Other Ambulatory Visit: Payer: Self-pay | Admitting: Internal Medicine

## 2022-04-14 DIAGNOSIS — Z1231 Encounter for screening mammogram for malignant neoplasm of breast: Secondary | ICD-10-CM

## 2022-05-06 ENCOUNTER — Ambulatory Visit
Admission: RE | Admit: 2022-05-06 | Discharge: 2022-05-06 | Disposition: A | Discharge status: Home / Self Care | Payer: Federal, State, Local not specified - PPO | Source: Ambulatory Visit | Attending: Internal Medicine | Admitting: Internal Medicine

## 2022-05-06 DIAGNOSIS — Z1231 Encounter for screening mammogram for malignant neoplasm of breast: Secondary | ICD-10-CM | POA: Diagnosis present

## 2022-07-02 ENCOUNTER — Other Ambulatory Visit: Payer: Self-pay

## 2022-07-02 DIAGNOSIS — Z122 Encounter for screening for malignant neoplasm of respiratory organs: Secondary | ICD-10-CM

## 2022-07-02 DIAGNOSIS — Z87891 Personal history of nicotine dependence: Secondary | ICD-10-CM

## 2022-07-14 ENCOUNTER — Ambulatory Visit
Admission: RE | Admit: 2022-07-14 | Discharge: 2022-07-14 | Disposition: A | Discharge status: Home / Self Care | Payer: Federal, State, Local not specified - PPO | Source: Ambulatory Visit | Attending: Acute Care | Admitting: Acute Care

## 2022-07-14 DIAGNOSIS — Z122 Encounter for screening for malignant neoplasm of respiratory organs: Secondary | ICD-10-CM | POA: Insufficient documentation

## 2022-07-14 DIAGNOSIS — Z87891 Personal history of nicotine dependence: Secondary | ICD-10-CM | POA: Diagnosis not present

## 2022-07-16 DIAGNOSIS — Z87891 Personal history of nicotine dependence: Secondary | ICD-10-CM

## 2022-07-16 DIAGNOSIS — Z122 Encounter for screening for malignant neoplasm of respiratory organs: Secondary | ICD-10-CM

## 2022-08-10 ENCOUNTER — Other Ambulatory Visit: Payer: Self-pay | Admitting: Ophthalmology

## 2022-08-10 DIAGNOSIS — H5347 Heteronymous bilateral field defects: Secondary | ICD-10-CM

## 2022-08-14 ENCOUNTER — Ambulatory Visit: Payer: Federal, State, Local not specified - PPO

## 2022-08-23 ENCOUNTER — Ambulatory Visit
Admission: RE | Admit: 2022-08-23 | Discharge: 2022-08-23 | Disposition: A | Discharge status: Home / Self Care | Payer: Federal, State, Local not specified - PPO | Source: Ambulatory Visit | Attending: Ophthalmology | Admitting: Ophthalmology

## 2022-08-23 DIAGNOSIS — H5347 Heteronymous bilateral field defects: Secondary | ICD-10-CM | POA: Diagnosis present

## 2022-08-23 MED ORDER — GADOBUTROL 1 MMOL/ML IV SOLN
5.0000 mL | Freq: Once | INTRAVENOUS | Status: AC | PRN
Start: 2022-08-23 — End: 2022-08-23
  Administered 2022-08-23: 5 mL via INTRAVENOUS

## 2022-09-06 ENCOUNTER — Encounter (INDEPENDENT_AMBULATORY_CARE_PROVIDER_SITE_OTHER): Payer: Self-pay

## 2022-09-06 ENCOUNTER — Other Ambulatory Visit: Payer: Self-pay | Admitting: Internal Medicine

## 2022-09-06 DIAGNOSIS — N183 Chronic kidney disease, stage 3 unspecified: Secondary | ICD-10-CM

## 2022-09-15 ENCOUNTER — Other Ambulatory Visit (INDEPENDENT_AMBULATORY_CARE_PROVIDER_SITE_OTHER): Payer: Self-pay | Admitting: Vascular Surgery

## 2022-09-15 ENCOUNTER — Ambulatory Visit (INDEPENDENT_AMBULATORY_CARE_PROVIDER_SITE_OTHER): Payer: Federal, State, Local not specified - PPO | Admitting: Nurse Practitioner

## 2022-09-15 ENCOUNTER — Encounter (INDEPENDENT_AMBULATORY_CARE_PROVIDER_SITE_OTHER): Payer: Federal, State, Local not specified - PPO

## 2022-09-15 DIAGNOSIS — I6523 Occlusion and stenosis of bilateral carotid arteries: Secondary | ICD-10-CM

## 2022-09-21 ENCOUNTER — Ambulatory Visit (INDEPENDENT_AMBULATORY_CARE_PROVIDER_SITE_OTHER): Payer: Federal, State, Local not specified - PPO | Admitting: Nurse Practitioner

## 2022-09-21 ENCOUNTER — Encounter (INDEPENDENT_AMBULATORY_CARE_PROVIDER_SITE_OTHER): Payer: Self-pay

## 2022-09-21 ENCOUNTER — Encounter (INDEPENDENT_AMBULATORY_CARE_PROVIDER_SITE_OTHER): Payer: Federal, State, Local not specified - PPO

## 2023-04-18 ENCOUNTER — Other Ambulatory Visit: Payer: Self-pay

## 2023-04-18 DIAGNOSIS — Z1231 Encounter for screening mammogram for malignant neoplasm of breast: Secondary | ICD-10-CM

## 2023-05-18 ENCOUNTER — Telehealth: Payer: Self-pay | Admitting: *Deleted

## 2023-05-18 NOTE — Telephone Encounter (Signed)
FYI: Per CMS Age guidelines for lung cancer screening, patient will no longer qualify for lung cancer screening. Patient must be between age 79-77 to qualify. Note sent to PCP to make them aware.

## 2023-06-14 ENCOUNTER — Ambulatory Visit: Payer: Federal, State, Local not specified - PPO

## 2023-07-25 ENCOUNTER — Ambulatory Visit
Admission: RE | Admit: 2023-07-25 | Discharge: 2023-07-25 | Disposition: A | Discharge status: Home / Self Care | Payer: Federal, State, Local not specified - PPO | Source: Ambulatory Visit | Attending: Internal Medicine | Admitting: Internal Medicine

## 2023-07-25 DIAGNOSIS — Z1231 Encounter for screening mammogram for malignant neoplasm of breast: Secondary | ICD-10-CM | POA: Insufficient documentation

## 2023-09-27 ENCOUNTER — Encounter (INDEPENDENT_AMBULATORY_CARE_PROVIDER_SITE_OTHER): Payer: Federal, State, Local not specified - PPO

## 2023-09-27 ENCOUNTER — Ambulatory Visit (INDEPENDENT_AMBULATORY_CARE_PROVIDER_SITE_OTHER): Payer: Federal, State, Local not specified - PPO | Admitting: Vascular Surgery

## 2024-03-05 ENCOUNTER — Other Ambulatory Visit: Payer: Self-pay

## 2024-03-05 ENCOUNTER — Emergency Department

## 2024-03-05 ENCOUNTER — Emergency Department
Admission: EM | Admit: 2024-03-05 | Discharge: 2024-03-05 | Disposition: A | Discharge status: Home / Self Care | Attending: Emergency Medicine | Admitting: Emergency Medicine

## 2024-03-05 DIAGNOSIS — Z7901 Long term (current) use of anticoagulants: Secondary | ICD-10-CM | POA: Diagnosis not present

## 2024-03-05 DIAGNOSIS — I160 Hypertensive urgency: Secondary | ICD-10-CM | POA: Insufficient documentation

## 2024-03-05 DIAGNOSIS — R519 Headache, unspecified: Secondary | ICD-10-CM | POA: Insufficient documentation

## 2024-03-05 DIAGNOSIS — I10 Essential (primary) hypertension: Secondary | ICD-10-CM | POA: Insufficient documentation

## 2024-03-05 DIAGNOSIS — J069 Acute upper respiratory infection, unspecified: Secondary | ICD-10-CM | POA: Insufficient documentation

## 2024-03-05 DIAGNOSIS — R059 Cough, unspecified: Secondary | ICD-10-CM | POA: Diagnosis present

## 2024-03-05 DIAGNOSIS — D72829 Elevated white blood cell count, unspecified: Secondary | ICD-10-CM | POA: Insufficient documentation

## 2024-03-05 LAB — CBC WITH DIFFERENTIAL/PLATELET
Abs Immature Granulocytes: 0.11 10*3/uL — ABNORMAL HIGH (ref 0.00–0.07)
Basophils Absolute: 0 10*3/uL (ref 0.0–0.1)
Basophils Relative: 0 %
Eosinophils Absolute: 0 10*3/uL (ref 0.0–0.5)
Eosinophils Relative: 0 %
HCT: 42 % (ref 36.0–46.0)
Hemoglobin: 13.3 g/dL (ref 12.0–15.0)
Immature Granulocytes: 1 %
Lymphocytes Relative: 7 %
Lymphs Abs: 0.8 10*3/uL (ref 0.7–4.0)
MCH: 25.8 pg — ABNORMAL LOW (ref 26.0–34.0)
MCHC: 31.7 g/dL (ref 30.0–36.0)
MCV: 81.6 fL (ref 80.0–100.0)
Monocytes Absolute: 0.4 10*3/uL (ref 0.1–1.0)
Monocytes Relative: 3 %
Neutro Abs: 10.9 10*3/uL — ABNORMAL HIGH (ref 1.7–7.7)
Neutrophils Relative %: 89 %
Platelets: 249 10*3/uL (ref 150–400)
RBC: 5.15 MIL/uL — ABNORMAL HIGH (ref 3.87–5.11)
RDW: 16.6 % — ABNORMAL HIGH (ref 11.5–15.5)
WBC: 12.3 10*3/uL — ABNORMAL HIGH (ref 4.0–10.5)
nRBC: 0 % (ref 0.0–0.2)

## 2024-03-05 LAB — COMPREHENSIVE METABOLIC PANEL WITH GFR
ALT: 23 U/L (ref 0–44)
AST: 28 U/L (ref 15–41)
Albumin: 4.4 g/dL (ref 3.5–5.0)
Alkaline Phosphatase: 87 U/L (ref 38–126)
Anion gap: 10 (ref 5–15)
BUN: 18 mg/dL (ref 8–23)
CO2: 22 mmol/L (ref 22–32)
Calcium: 9.9 mg/dL (ref 8.9–10.3)
Chloride: 106 mmol/L (ref 98–111)
Creatinine, Ser: 1.2 mg/dL — ABNORMAL HIGH (ref 0.44–1.00)
GFR, Estimated: 46 mL/min — ABNORMAL LOW (ref >60)
Glucose, Bld: 175 mg/dL — ABNORMAL HIGH (ref 70–99)
Potassium: 3.7 mmol/L (ref 3.5–5.1)
Sodium: 138 mmol/L (ref 135–145)
Total Bilirubin: 0.6 mg/dL (ref 0.0–1.2)
Total Protein: 7.4 g/dL (ref 6.5–8.1)

## 2024-03-05 LAB — PROTIME-INR
INR: 4 — ABNORMAL HIGH (ref 0.8–1.2)
Prothrombin Time: 38.9 s — ABNORMAL HIGH (ref 11.4–15.2)

## 2024-03-05 MED ORDER — GUAIFENESIN-CODEINE 100-10 MG/5ML PO SOLN: 5.0000 mL | Freq: Three times a day (TID) | ORAL | 0 refills | Status: DC | PRN

## 2024-03-05 MED ORDER — ACETAMINOPHEN 325 MG PO TABS
650.0000 mg | ORAL_TABLET | Freq: Once | ORAL | Status: DC
  Filled 2024-03-05: qty 2

## 2024-03-05 NOTE — Discharge Instructions (Addendum)
 You were seen in the ER for your cough and congestion.  I suspect you have an upper respiratory illness.  I have sent a prescription for a cough syrup to your pharmacy.  This can make you drowsy, do not drive or operate machinery when taking this.  Your blood pressure was elevated.  Please avoid taking any over-the-counter medications that are not specifically for people with high blood pressure.  Follow with your primary care doctor for further evaluation.  Return to the ER for new or worsening symptoms.

## 2024-03-05 NOTE — ED Triage Notes (Signed)
 Patient comes in from [facility] after being seen for congestion, headache and cough. Pt brought over to ER for hypertension. Patient states that she took her BP medicine this morning. Pt  is taking Warfarin, and has a history of AFIB, Aortic valve replacement, and hypertension. No complaints of CP or dizziness.  Pt is alert and oriented x4 with no signs of acute distress at this time.

## 2024-03-05 NOTE — ED Notes (Signed)
 Pt coughing up a small amount of blood. MD messaged through secure chat at this time

## 2024-03-05 NOTE — ED Triage Notes (Addendum)
 First nurse note: Brought over from [facility]. Seen at [facility] for chest congestion, headache and cough. Blood pressure reading was high at [facility] and brought. Took blood pressure medicine this AM.   History a-fib, COPD and hypertension

## 2024-03-05 NOTE — ED Provider Notes (Signed)
 [facility] Provider Note    Event Date/Time   First MD Initiated Contact with Patient 03/05/24 1301     (approximate)   History   Hypertension   HPI  Janice Tyler is a 78 year old female with history of A-fib, aortic valve replacement on warfarin, hypertension presenting to the emergency department for evaluation of cough and congestion.  For the past 5 to 6 days, patient has had ongoing symptoms.  Has been taking over-the-counter medicine including NyQuil.  Denies any chest pain or shortness of breath.  No vomiting or diarrhea.  Presented to walk-in clinic where she was noted to be hypertensive and directed to the ER for further evaluation.  Does additionally report a frontal headache, not sudden in onset, but atypical for the patient.  No numbness, tingling, focal weakness.      Physical Exam   Triage Vital Signs: ED Triage Vitals  Encounter Vitals Group     BP 03/05/24 1110 (!) 189/84     Systolic BP Percentile --      Diastolic BP Percentile --      Pulse Rate 03/05/24 1110 84     Resp 03/05/24 1110 19     Temp 03/05/24 1110 98.2 F (36.8 C)     Temp src --      SpO2 03/05/24 1110 98 %     Weight 03/05/24 1101 137 lb 9.6 oz (62.4 kg)     Height 03/05/24 1101 5' 2.5" (1.588 m)     Head Circumference --      Peak Flow --      Pain Score 03/05/24 1111 4     Pain Loc --      Pain Education --      Exclude from Growth Chart --     Most recent vital signs: Vitals:   03/05/24 1110 03/05/24 1504  BP: (!) 189/84 (!) 187/67  Pulse: 84 90  Resp: 19 18  Temp: 98.2 F (36.8 C)   SpO2: 98% 97%     General: Awake, interactive  CV:  Regular rate, good peripheral perfusion.  Resp:  Unlabored respirations, lungs clear to auscultation Abd:  Nondistended, soft, nontender Neuro:  Symmetric facial movement, fluid speech, 5 out of 5 strength in the bilateral upper and lower extremities with normal sensation   ED Results / Procedures / Treatments    Labs (all labs ordered are listed, but only abnormal results are displayed) Labs Reviewed  CBC WITH DIFFERENTIAL/PLATELET - Abnormal; Notable for the following components:      Result Value   WBC 12.3 (*)    RBC 5.15 (*)    MCH 25.8 (*)    RDW 16.6 (*)    Neutro Abs 10.9 (*)    Abs Immature Granulocytes 0.11 (*)    All other components within normal limits  PROTIME-INR - Abnormal; Notable for the following components:   Prothrombin Time 38.9 (*)    INR 4.0 (*)    All other components within normal limits  COMPREHENSIVE METABOLIC PANEL WITH GFR - Abnormal; Notable for the following components:   Glucose, Bld 175 (*)    Creatinine, Ser 1.20 (*)    GFR, Estimated 46 (*)    All other components within normal limits     EKG EKG independently reviewed interpreted by myself (ER attending) demonstrates:  EKG demonstrates normal sinus rhythm at a rate of 83, PR 172, QRS 148, QTc 484, right bundle branch block noted, no acute ST changes  RADIOLOGY Imaging independently reviewed and interpreted by myself demonstrates:  CXR without focal consolidation CT head without acute bleed   Formal Radiology Read:  CT Head Wo Contrast Result Date: 03/05/2024 CLINICAL DATA:  Headache, hypertension EXAM: CT HEAD WITHOUT CONTRAST TECHNIQUE: Contiguous axial images were obtained from the base of the skull through the vertex without intravenous contrast. RADIATION DOSE REDUCTION: This exam was performed according to the departmental dose-optimization program which includes automated exposure control, adjustment of the mA and/or kV according to patient size and/or use of iterative reconstruction technique. COMPARISON:  01/15/2021 FINDINGS: Brain: No evidence of acute infarct or hemorrhage. Chronic small vessel ischemic changes are seen within the periventricular white matter. Lateral ventricles and midline structures are stable. No acute extra-axial fluid collections. No mass effect. Vascular: Stable  atherosclerosis.  No hyperdense vessel. Skull: Normal. Negative for fracture or focal lesion. Sinuses/Orbits: No acute finding. Other: None. IMPRESSION: 1. Stable head CT, no acute intracranial process. Electronically Signed   By: Bobbye Burrow M.D.   On: 03/05/2024 15:15   DG Chest 2 View Result Date: 03/05/2024 CLINICAL DATA:  Hypertension and cough EXAM: CHEST - 2 VIEW COMPARISON:  CT 07/14/2022 lung cancer screening FINDINGS: Hyperinflation. Mild linear opacity at the bases likely scar or atelectasis. No pneumothorax, edema or consolidation. No effusion. Normal cardiopericardial silhouette calcified aorta. Sternal wires. Prosthetic valve. Degenerative changes along the spine on lateral view. Epicardial leads. IMPRESSION: Postop chest.  Hyperinflation.  Basilar scar or atelectasis. Electronically Signed   By: Adrianna Horde M.D.   On: 03/05/2024 13:47    PROCEDURES:  Critical Care performed: No  Procedures   MEDICATIONS ORDERED IN ED: Medications  acetaminophen  (TYLENOL ) tablet 650 mg (has no administration in time range)     IMPRESSION / MDM / ASSESSMENT AND PLAN / ED COURSE  I reviewed the triage vital signs and the nursing notes.  Differential diagnosis includes, but is not limited to, viral illness, pneumonia, pneumothorax, hypertensive urgency, lower suspicion hypertensive emergency but consideration for intracranial bleed, pulmonary edema  Patient's presentation is most consistent with acute presentation with potential threat to life or bodily function.  78 year old female presenting with cough and congestion noted to be hypertensive on presentation.  Denying any chest pain or shortness of breath.  Labs from triage with mild leukocytosis, mild creatinine elevation at 1.2.  INR slightly supratherapeutic at 4, goal 2.5-3.5.  Chest x-Latonya Nelon without acute findings.  CT head without acute bleed.  Suspect her hypertension may be related to her use of over-the-counter medications including  NyQuil.  Patient reassessed.  Reports ongoing headache, ordered for Tylenol .  Does also note very small volume hemoptysis.  I did discuss a CT scan to further evaluate for alternative pathology, but patient would prefer to hold off on this.  Given a clinical presentation most suggestive of viral illness, do think this is reasonable.  Will DC with cough syrup.  Instructed to only take over-the-counter medications safe for high blood pressure.  Strict return precautions provided.  Patient discharged in stable condition.     FINAL CLINICAL IMPRESSION(S) / ED DIAGNOSES   Final diagnoses:  Upper respiratory tract infection, unspecified type  Hypertensive urgency     Rx / DC Orders   ED Discharge Orders          Ordered    guaiFENesin -codeine 100-10 MG/5ML syrup  3 times daily PRN        03/05/24 1532  Note:  This document was prepared using Dragon voice recognition software and may include unintentional dictation errors.   Claria Crofts, MD 03/05/24 [phone]

## 2024-03-05 NOTE — ED Notes (Signed)
 Pt verbalizes understanding of discharge instructions. Opportunity for questioning and answers were provided. Pt discharged from ED to home.   ? ?

## 2024-03-27 ENCOUNTER — Encounter (INDEPENDENT_AMBULATORY_CARE_PROVIDER_SITE_OTHER): Payer: Self-pay

## 2024-10-02 ENCOUNTER — Ambulatory Visit

## 2024-10-02 DIAGNOSIS — L720 Epidermal cyst: Secondary | ICD-10-CM | POA: Diagnosis not present

## 2024-10-02 DIAGNOSIS — L601 Onycholysis: Secondary | ICD-10-CM

## 2024-10-02 NOTE — Progress Notes (Signed)
    Subjective   Janice Tyler is a 78 y.o. female who presents for the following: Lesion(s) of concern . Patient is new patient  Today patient reports: Issue with fingernail of fourth digit of right hand Area of concern on the chest  Review of Systems:    No other skin or systemic complaints except as noted in HPI or Assessment and Plan.  The following portions of the chart were reviewed this encounter and updated as appropriate: medications, allergies, medical history  Relevant Medical History:  n/a   Objective  (SKPE) Well appearing patient in no apparent distress; mood and affect are within normal limits. Examination was performed of the: Focused Exam of: Right hand and right clavicle   Examination notable for: - A firm, skin-colored, subcutaneous nodule that is freely movable and has a central punctum located on the right clavicle. - Significant separation of the nail plate from the nail bed affecting the fourth finger of the right hand.  Examination limited by: Clothing and Patient deferred removal       Assessment & Plan  (SKAP)   Subcutaneous cyst, favor epidermal inclusion cyst of the right clavicle  - Explained to patient this most likely is consistent with an epidermal inclusion cyst, which represents trapped hair follicule and skin cells under the skin - Benign, patient reassured - Given that the lesion is symptomatic, patient would like to proceed with excision. They will be scheduled for surgical removal.  Simple onycholysis:  - Counseled patient about etiology, natural history, and treatment options for the condition Recommended:  - Please keep nails as short as comfortable to prevent lever effect, snagging, and further lifting. - Avoid excessive hand washing or prolonged contact in water as much as possible. - Wear light cotton gloves under vinyl gloves for wet work. - Avoid touching citrus fruits or raw food with bare hands. - Avoid trauma to the nail -- no  cleaning under the nail edge.  - Best to avoid nail cosmetics until the nail has looked normal for at least a month. Nail polish remover can be particularly harsh on the nails. If want to restart at that point, start with a clear coat first. Best to avoid nail hardeners, all formaldehyde containing products, acrylic or gel nails.  - Nail-Fungal-ID Molecular Diagnostic test performed today.  Discussed with patient their insurance will be billed.  Advised the patient they may get a bill for a portion that's not covered by their insurance.  Should the patient have any issues with their remaining responsibility they will not be sent to collections but KRISTINE will work with them internally on any remaining balance.      Level of service outlined above   Patient instructions (SKPI)   Procedures, orders, diagnosis for this visit:    There are no diagnoses linked to this encounter.  Return to clinic: Return for Surgery for cyst removal on right clavicle .  I, Emerick Ege, CMA am acting as scribe for .   Documentation: I have reviewed the above documentation for accuracy and completeness, and I agree with the above.  Lauraine JAYSON Kanaris, MD

## 2024-10-02 NOTE — Patient Instructions (Signed)

## 2024-10-09 ENCOUNTER — Ambulatory Visit: Payer: Self-pay

## 2024-10-09 NOTE — Progress Notes (Signed)
 LMTRC

## 2024-10-11 NOTE — Telephone Encounter (Signed)
-----   Message from Lauraine JAYSON Kanaris sent at 10/09/2024  8:59 AM EST ----- Please notify patient with below plan: - Culture showing normal skin flora, pseudomonas bacteria  - start vinegar soaks twice daily for 5 min  - please advise patient that this may help kill any residual bacteria that is there and promote nail regrowth. As discussed at her last visit, the nail may not reattach normally    INSTRUCTION FOR DILUTE WHITE VINEGAR SOAKS:    A white vinegar soak is made of 1 part white vinegar and 3 parts water.  Soak the affected area twice a day for 5 minutes.   ----- Message ----- From: Rebecka Fleeta Higashi In One Three One Sent: 10/08/2024   9:46 AM EST To: Lauraine JAYSON Kanaris, MD

## 2024-10-11 NOTE — Telephone Encounter (Signed)
Left message on voicemail to return my call.  

## 2024-10-15 NOTE — Telephone Encounter (Signed)
-----   Message from Lauraine JAYSON Kanaris sent at 10/09/2024  8:59 AM EST ----- Please notify patient with below plan: - Culture showing normal skin flora, pseudomonas bacteria  - start vinegar soaks twice daily for 5 min  - please advise patient that this may help kill any residual bacteria that is there and promote nail regrowth. As discussed at her last visit, the nail may not reattach normally    INSTRUCTION FOR DILUTE WHITE VINEGAR SOAKS:    A white vinegar soak is made of 1 part white vinegar and 3 parts water.  Soak the affected area twice a day for 5 minutes.   ----- Message ----- From: Rebecka Fleeta Higashi In One Three One Sent: 10/08/2024   9:46 AM EST To: Lauraine JAYSON Kanaris, MD

## 2024-10-15 NOTE — Telephone Encounter (Signed)
 Discussed culture results with patient and advised her to keep follow up appointment in January.

## 2024-10-23 ENCOUNTER — Telehealth: Payer: Self-pay

## 2024-10-23 NOTE — Telephone Encounter (Signed)
 Patient left a nurse VM asking if she was okay to get a thin layer of powder dip on her nails after you have examined her current nail issues?

## 2024-10-23 NOTE — Telephone Encounter (Signed)
 Patient advised of information per Dr. Raymund. aw

## 2024-10-24 ENCOUNTER — Other Ambulatory Visit: Payer: Self-pay | Admitting: Internal Medicine

## 2024-10-24 DIAGNOSIS — Z1231 Encounter for screening mammogram for malignant neoplasm of breast: Secondary | ICD-10-CM

## 2024-11-20 ENCOUNTER — Ambulatory Visit

## 2024-11-20 DIAGNOSIS — L720 Epidermal cyst: Secondary | ICD-10-CM | POA: Diagnosis not present

## 2024-11-20 NOTE — Patient Instructions (Signed)

## 2024-11-20 NOTE — Progress Notes (Signed)
" °  °  Subjective   Janice Tyler is a 79 y.o. female who presents for the following: Surgical excision of cyst.  Pacemaker/Defibrillator: Denies  Allergies: Denies  Anticoagulants/Aspirin : Yes - coumadin /Lovenox  for medical procedures if needed Heart valves: Yes -   Joint replacements:  Yes -   Patient is established patient   Today patient reports: Cyst of the R clavicle  Review of Systems:    No other skin or systemic complaints except as noted in HPI or Assessment and Plan.  The following portions of the chart were reviewed this encounter and updated as appropriate: medications, allergies, medical history  Relevant Medical History:  n/a   Objective  (SKPE) Well appearing patient in no apparent distress; mood and affect are within normal limits. Examination was performed of the: Focused Exam of: the chest   Examination notable for:  Examination limited by: n/a  R clavicle R clavicle 1.3 firm SQ nodule  Assessment & Plan  (SKAP)   Was sun protection counseling provided?: No    Procedures, orders, diagnosis for this visit:  EIC (EPIDERMAL INCLUSION CYST) R clavicle - Skin excision - R clavicle  Excision method:  punch Lesion length (cm):  1.3 Lesion width (cm):  1.3 Margin per side (cm):  0 Total excision diameter (cm):  1.3 Informed consent: discussed and consent obtained   Timeout: patient name, date of birth, surgical site, and procedure verified   Procedure prep:  Patient was prepped and draped in usual sterile fashion Prep type:  Chlorhexidine Anesthesia: the lesion was anesthetized in a standard fashion   Anesthetic:  1% lidocaine  w/ epinephrine 1-100,000 buffered w/ 8.4% NaHCO3 Instrument used comment:  4.0 punch Hemostasis achieved with: suture, pressure and electrodesiccation   Outcome: patient tolerated procedure well with no complications    - Skin repair - R clavicle Complexity:  Simple Final length (cm):  0.5 Informed consent: discussed and consent  obtained   Timeout: patient name, date of birth, surgical site, and procedure verified   Procedure prep:  Patient was prepped and draped in usual sterile fashion Prep type:  Chlorhexidine Anesthesia: the lesion was anesthetized in a standard fashion   Anesthetic:  1% lidocaine  w/ epinephrine 1-100,000 buffered w/ 8.4% NaHCO3 Undermining: edges could be approximated without difficulty   Fine/surface layer approximation (top stitches):  Suture size:  4-0 Suture type: Prolene (polypropylene)   Hemostasis achieved with: suture, pressure and electrodesiccation Outcome: patient tolerated procedure well with no complications   Post-procedure details: sterile dressing applied and wound care instructions given   Dressing type: petrolatum, bandage and pressure dressing    Specimen 1 - Surgical pathology Differential Diagnosis: r/o cyst vs other Check Margins: No  EIC (epidermal inclusion cyst) -     Skin excision -     Skin repair -     Surgical pathology; Standing   Level of service outlined above   Return to clinic: Return for suture removal in 7-10 days.  LILLETTE Rosina Mayans, CMA, am acting as scribe for Lauraine JAYSON Kanaris, MD .   Documentation: I have reviewed the above documentation for accuracy and completeness, and I agree with the above.  Lauraine JAYSON Kanaris, MD  "

## 2024-11-21 LAB — SURGICAL PATHOLOGY

## 2024-11-22 ENCOUNTER — Ambulatory Visit: Payer: Self-pay

## 2024-11-27 ENCOUNTER — Other Ambulatory Visit: Payer: Self-pay | Admitting: Medical Genetics

## 2024-11-28 ENCOUNTER — Ambulatory Visit

## 2024-11-28 ENCOUNTER — Other Ambulatory Visit
Admission: RE | Admit: 2024-11-28 | Discharge: 2024-11-28 | Disposition: A | Discharge status: Home / Self Care | Payer: Self-pay | Source: Ambulatory Visit | Attending: Medical Genetics | Admitting: Medical Genetics

## 2024-11-28 DIAGNOSIS — Z48817 Encounter for surgical aftercare following surgery on the skin and subcutaneous tissue: Secondary | ICD-10-CM | POA: Diagnosis not present

## 2024-11-28 DIAGNOSIS — L820 Inflamed seborrheic keratosis: Secondary | ICD-10-CM

## 2024-11-28 DIAGNOSIS — L601 Onycholysis: Secondary | ICD-10-CM | POA: Diagnosis not present

## 2024-11-28 NOTE — Progress Notes (Signed)
" °  °  Subjective   Janice Tyler is a 79 y.o. female who presents for the following: suture removal. Patient is established patient   Today patient reports: No complications since procedure.   Review of Systems:    No other skin or systemic complaints except as noted in HPI or Assessment and Plan.  The following portions of the chart were reviewed this encounter and updated as appropriate: medications, allergies, medical history  Relevant Medical History:  n/a   Objective  (SKPE) Well appearing patient in no apparent distress; mood and affect are within normal limits. Examination was performed of the: Focused Exam of: right clavicle   Examination notable for: well healing excision site  Onycholysis R hand       Right neck Stuck on waxy paps with erythema  Assessment & Plan  (SKAP)   Encounter for Removal of Sutures - Incision site at the right clavicle is clean, dry and intact - Wound cleansed, sutures removed, wound cleansed and steri strips applied.  - Discussed pathology results showing  EPIDERMOID CYST, FRAGMENTED   - Scars remodel for a full year. - Patient advised to call with any concerns or if they notice any new or changing lesions.   Simple onycholysis: s/p trauma to the nail of the fourth finger of the right hand - Counseled patient about etiology, natural history, and treatment options for the condition - Recommended Elon nail conditioner to apply to nails at bedtime and/or Isdn nail strengthener daily   Recommended:  - Please keep nails as short as comfortable to prevent lever effect, snagging, and further lifting. - Avoid excessive hand washing or prolonged contact in water as much as possible. - Wear light cotton gloves under vinyl gloves for wet work. - Avoid touching citrus fruits or raw food with bare hands. - Avoid trauma to the nail -- no cleaning under the nail edge.  - Best to avoid nail cosmetics until the nail has looked normal for at least a  month. Nail polish remover can be particularly harsh on the nails. If want to restart at that point, start with a clear coat first. Best to avoid nail hardeners, all formaldehyde containing products, acrylic or gel nails.    Was sun protection counseling provided?: No   Level of service outlined above   Patient instructions (SKPI)   Procedures, orders, diagnosis for this visit:  INFLAMED SEBORRHEIC KERATOSIS Right neck Symptomatic, irritating, patient would like treated. - Destruction of lesion - Right neck Complexity: simple   Destruction method: cryotherapy   Informed consent: discussed and consent obtained   Timeout:  patient name, date of birth, surgical site, and procedure verified Lesion destroyed using liquid nitrogen: Yes   Region frozen until ice ball extended beyond lesion: Yes   Cryo cycles: 1 or 2. Outcome: patient tolerated procedure well with no complications   Post-procedure details: wound care instructions given     Inflamed seborrheic keratosis -     Destruction of lesion    Return to clinic: Return if symptoms worsen or fail to improve.  I, Emerick Ege, CMA am acting as scribe for Janice JAYSON Kanaris, MD.   Documentation: I have reviewed the above documentation for accuracy and completeness, and I agree with the above.  Janice JAYSON Kanaris, MD  "

## 2024-11-28 NOTE — Patient Instructions (Signed)
 Scar Handout  A scar forms as a normal part of the skin's healing process. Scars may form from surgery, trauma (accidents, burns, etc.), infections such as chickenpox, or inflammation in the skin such as acne.   Types of scars Scars may be flat, indented, or raised. Scars may be lighter or darker in color, pink or red. Hypertrophic scars and keloid scars form when an excessive amount of scar tissue is formed, making the scar more raised than usual. A hypertrophic scar is an exaggerated type of scar but stays within the edges of the injury. A keloid scar spills over beyond the edges of the injury and may grow much like a tumor.  The type of scar depends on many factors including age, location on body, and skin type. In time, most scars become less noticeable, but scars do not go away completely.   When to seek treatment for a scar: In most people, scars do not cause significant problems. Patients may seek treatment for scars for different reasons: -Emotional/social. Scars may develop on very visible areas of the body, or may be a reminder of a traumatic event. Scars in these situations may cause significant stress.  -Symptoms. Some scars may cause symptoms such as pain, itching, tingling, or numbness.  -Limitation of function. Some scars may limit activities by making it hard to move the body part with the scar.   What treatments are available for scars? Different treatment options are available for scars. The type of treatment depends on the type of scar, location, age, and how bothersome the scar is. The goal of treatment is to make a scar feel and look better. Treatments do not make scars disappear entirely but can help them fade more quickly and become less noticeable. Treatment options include: topical products (creams, ointments, patches), injections, laser, chemical peels, dermabrasion, cryotherapy (freezing with liquid nitrogen), radiation, and surgery.   How to minimize a scar: Scars  cannot always be prevented. The following are recommendations for reducing the appearance of scars caused by injury: -Wound care. After a surgery or an injury, follow your doctor's wound care instructions closely. This will allow the best healing possible. Most non-surgical wounds heal best when they are kept clean and covered with a bandage that keeps them from getting too wet or too dry.  -Sun protection. This will prevent darkening of the scar.  -Scar massage. Regular massage of the scar can help to make it softer and flatter.  - Other local measures like downward pressure with silicone sheet, steri strips, or tape may also help with scar formation.   Silicone scar reducing sheets or gel:   Use as directed - briefly, if you choose the sheets, cut to size and you can use for up to 1 week.  You can peel off before showering/exercise and then reapply after drying the area. The patch can be washed with mild soap and water and reused until it begins to disintegrate or about 7 days. Replace every week and use for a total of about 8 weeks. Rarely people may develop an allergy to the adhesive used - if this happens, stop using the sheets and switch to the gel - apply 1-2x/day for 8 weeks total.   Brands to try: 1. Generic drugstore brands are just fine-- [facility], [facility], etc.  Just ensure silicone is the main ingredient. 2. Cica-Care Silicone Gel Sheeting:To purchase call [phone] or visit any one of several websites: [employer], [employer].com, Amazon.com 3. Novagel Silicone Gel Sheets: To purchase visit  any of several websites: Amazon.com, [employer]Med.com, [employer]tn 4. ScarAway: found at most major drug stores and [employer].[email or web address]     Due to recent changes in healthcare laws, you may see results of your pathology and/or laboratory studies on MyChart before the doctors have had a chance to review them. We understand that in some cases there may be results that are confusing or  concerning to you. Please understand that not all results are received at the same time and often the doctors may need to interpret multiple results in order to provide you with the best plan of care or course of treatment. Therefore, we ask that you please give us  2 business days to thoroughly review all your results before contacting the office for clarification. Should we see a critical lab result, you will be contacted sooner.   If You Need Anything After Your Visit  If you have any questions or concerns for your doctor, please call our main line at [phone] and press option 4 to reach your doctor's medical assistant. If no one answers, please leave a voicemail as directed and we will return your call as soon as possible. Messages left after 4 pm will be answered the following business day.   You may also send us  a message via MyChart. We typically respond to MyChart messages within 1-2 business days.  For prescription refills, please ask your pharmacy to contact our office. Our fax number is [phone].  If you have an urgent issue when the clinic is closed that cannot wait until the next business day, you can page your doctor at the number below.    Please note that while we do our best to be available for urgent issues outside of office hours, we are not available 24/7.   If you have an urgent issue and are unable to reach us , you may choose to seek medical care at your doctor's office, retail clinic, urgent care center, or emergency room.  If you have a medical emergency, please immediately call 911 or go to the emergency department.  Pager Numbers  - Dr. Hester: [phone]  - Dr. Jackquline: [phone]  - Dr. Claudene: [phone]   - Dr. Raymund: [phone]  In the event of inclement weather, please call our main line at [phone] for an update on the status of any delays or closures.  Dermatology Medication Tips: Please keep the boxes that topical medications come  in in order to help keep track of the instructions about where and how to use these. Pharmacies typically print the medication instructions only on the boxes and not directly on the medication tubes.   If your medication is too expensive, please contact our office at [phone] option 4 or send us  a message through MyChart.   We are unable to tell what your co-pay for medications will be in advance as this is different depending on your insurance coverage. However, we may be able to find a substitute medication at lower cost or fill out paperwork to get insurance to cover a needed medication.   If a prior authorization is required to get your medication covered by your insurance company, please allow us  1-2 business days to complete this process.  Drug prices often vary depending on where the prescription is filled and some pharmacies may offer cheaper prices.  The website www.goodrx.com contains coupons for medications through different pharmacies. The prices here do not account for what the cost may be with help from insurance (it may be cheaper with your  insurance), but the website can give you the price if you did not use any insurance.  - You can print the associated coupon and take it with your prescription to the pharmacy.  - You may also stop by our office during regular business hours and pick up a GoodRx coupon card.  - If you need your prescription sent electronically to a different pharmacy, notify our office through [facility] or by phone at [phone] option 4.     Si Usted Necesita Algo Despus de Su Visita  Tambin puede enviarnos un mensaje a travs de Clinical Cytogeneticist. Por lo general respondemos a los mensajes de MyChart en el transcurso de 1 a 2 das hbiles.  Para renovar recetas, por favor pida a su farmacia que se ponga en contacto con nuestra oficina. Randi lakes de fax es Nathalie [phone].  Si tiene un asunto urgente cuando la clnica est cerrada y que no puede  esperar hasta el siguiente da hbil, puede llamar/localizar a su doctor(a) al nmero que aparece a continuacin.   Por favor, tenga en cuenta que aunque hacemos todo lo posible para estar disponibles para asuntos urgentes fuera del horario de [place], no estamos disponibles las 24 horas del da, los 7 [address]  [address] de la [place].   Si tiene un problema urgente y no puede comunicarse con nosotros, puede optar por buscar atencin mdica  en el consultorio de su doctor(a), en una clnica privada, en un centro de atencin urgente o en una sala de emergencias.  Si tiene engineer, drilling, por favor llame inmediatamente al 911 o vaya a la sala de emergencias.  Nmeros de bper  - Dr. Hester: [phone]  - Dra. Jackquline: [phone]  - Dr. Claudene: [phone]  - Dra. Kitts: [phone]  En caso de inclemencias del [place], por favor llame a nuestra lnea principal al [phone] para una actualizacin sobre el estado de cualquier retraso o cierre.  Consejos para la medicacin en dermatologa: Por favor, guarde las cajas en las que vienen los medicamentos de uso tpico para ayudarle a seguir las instrucciones sobre dnde y cmo usarlos. Las farmacias generalmente imprimen las instrucciones del medicamento slo en las cajas y no directamente en los tubos del [place].   Si su medicamento es muy caro, por favor, pngase en contacto con landry rieger llamando al [phone] y presione la opcin 4 o envenos un mensaje a travs de Clinical Cytogeneticist.   No podemos decirle cul ser su copago por los medicamentos por adelantado ya que esto es diferente dependiendo de la cobertura de su seguro. Sin embargo, es posible que podamos encontrar un medicamento sustituto a audiological scientist un formulario para que el seguro cubra el medicamento que se considera necesario.   Si se requiere una autorizacin previa para que su compaa de seguros cubra su medicamento, por favor permtanos de 1 a 2 das hbiles para  completar este proceso.  Los precios de los medicamentos varan con frecuencia dependiendo del environmental consultant de dnde se surte la receta y alguna farmacias pueden ofrecer precios ms baratos.  El sitio web www.goodrx.com tiene cupones para medicamentos de health and safety inspector. Los precios aqu no tienen en cuenta lo que podra costar con la ayuda del seguro (puede ser ms barato con su seguro), pero el sitio web puede darle el precio si no utiliz tourist information centre manager.  - Puede imprimir el cupn correspondiente y llevarlo con su receta a la farmacia.  - Tambin puede pasar por nuestra oficina durante el horario de atencin regular  y recoger una tarjeta de cupones de GoodRx.  - Si necesita que su receta se enve electrnicamente a una farmacia diferente, informe a nuestra oficina a travs de MyChart de [place] o por telfono llamando al [phone] y presione la opcin 4.

## 2024-11-29 ENCOUNTER — Ambulatory Visit
Admission: RE | Admit: 2024-11-29 | Discharge: 2024-11-29 | Disposition: A | Discharge status: Home / Self Care | Source: Ambulatory Visit | Attending: Internal Medicine | Admitting: Internal Medicine

## 2024-11-29 ENCOUNTER — Encounter: Payer: Self-pay | Admitting: Internal Medicine

## 2024-11-29 ENCOUNTER — Other Ambulatory Visit: Payer: Self-pay | Admitting: Internal Medicine

## 2024-11-29 DIAGNOSIS — Z1231 Encounter for screening mammogram for malignant neoplasm of breast: Secondary | ICD-10-CM | POA: Insufficient documentation

## 2024-11-29 DIAGNOSIS — N6321 Unspecified lump in the left breast, upper outer quadrant: Secondary | ICD-10-CM

## 2024-11-30 ENCOUNTER — Other Ambulatory Visit: Payer: Self-pay | Admitting: Internal Medicine

## 2024-11-30 DIAGNOSIS — Z1231 Encounter for screening mammogram for malignant neoplasm of breast: Secondary | ICD-10-CM

## 2024-11-30 DIAGNOSIS — N63 Unspecified lump in unspecified breast: Secondary | ICD-10-CM

## 2024-11-30 DIAGNOSIS — N6321 Unspecified lump in the left breast, upper outer quadrant: Secondary | ICD-10-CM

## 2024-12-06 ENCOUNTER — Ambulatory Visit
Admission: RE | Admit: 2024-12-06 | Discharge: 2024-12-06 | Disposition: A | Discharge status: Home / Self Care | Source: Ambulatory Visit | Attending: Internal Medicine | Admitting: Internal Medicine

## 2024-12-06 DIAGNOSIS — N6321 Unspecified lump in the left breast, upper outer quadrant: Secondary | ICD-10-CM | POA: Diagnosis present

## 2024-12-06 DIAGNOSIS — Z1231 Encounter for screening mammogram for malignant neoplasm of breast: Secondary | ICD-10-CM | POA: Diagnosis present

## 2024-12-07 LAB — GENECONNECT MOLECULAR SCREEN: Genetic Analysis Overall Interpretation: NEGATIVE
